# Patient Record
Sex: Male | Born: 1975 | Race: White | Hispanic: No | Marital: Single | Smoking: Current every day smoker
Health system: Southern US, Community
[De-identification: ages and names within clinical notes are randomized; demographics above are authoritative.]

## PROBLEM LIST (undated history)

## (undated) DIAGNOSIS — K219 Gastro-esophageal reflux disease without esophagitis: Secondary | ICD-10-CM

---

## 2000-03-26 ENCOUNTER — Emergency Department (HOSPITAL_COMMUNITY): Admission: EM | Admit: 2000-03-26 | Discharge: 2000-03-26 | Payer: Self-pay | Admitting: Emergency Medicine

## 2006-09-15 ENCOUNTER — Emergency Department (HOSPITAL_COMMUNITY): Admission: EM | Admit: 2006-09-15 | Discharge: 2006-09-15 | Payer: Self-pay | Admitting: Emergency Medicine

## 2008-09-03 IMAGING — CT CT HEAD W/O CM
1 series · 16 of 30 positions shown, 20 images · IV contrast (agent unspecified)
Comparison: None.

CLINICAL DATA: 30-year-old with head injury who fell.
 HEAD CT WITHOUT CONTRAST:
TECHNIQUE: Contiguous axial images were obtained from the base of the skull through the vertex according to standard protocol without contrast.

[Series 2: head_seq 4.5 h45s st · axial · 0.43mm/px · z∈[+1176,+1320]mm · 16 of 36 slices shown, 20 images]
[im 2/36  brain]
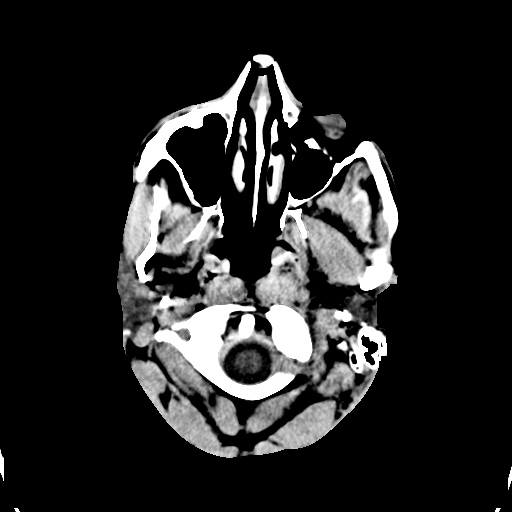
[im 2/36  bone]
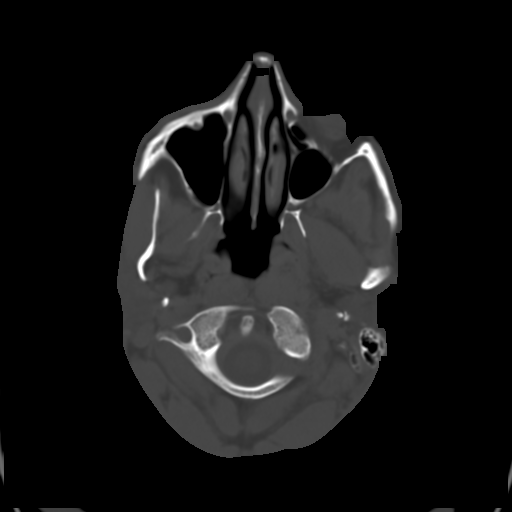
[im 4/36  brain]
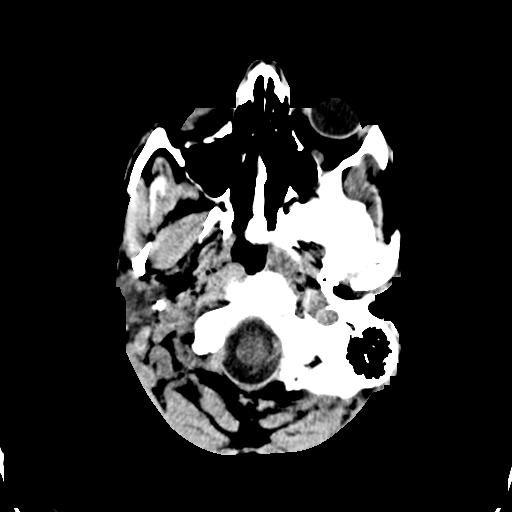
[im 7/36  brain]
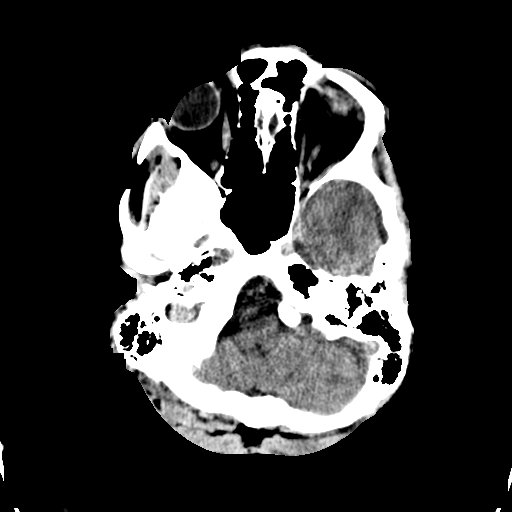
[im 9/36  brain]
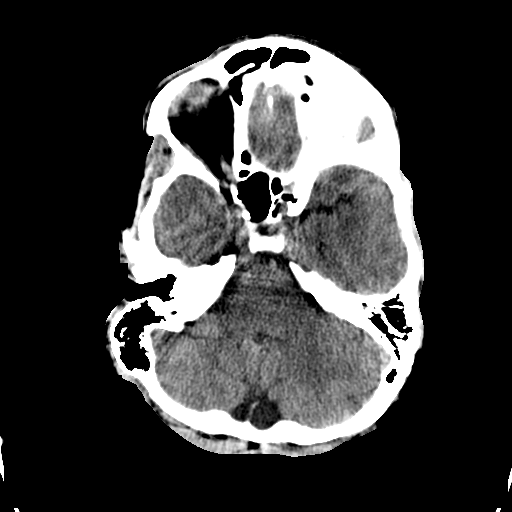
[im 10/36  brain]
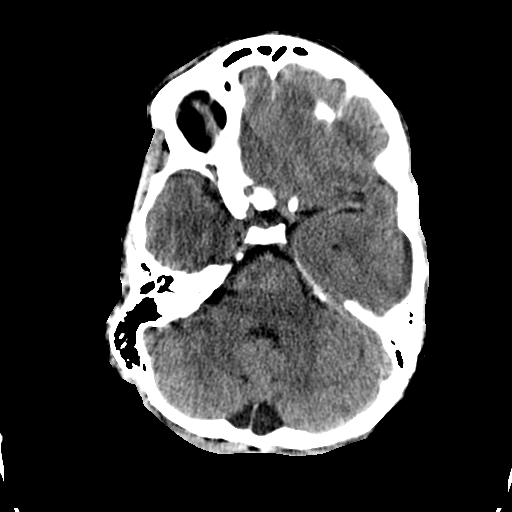
[im 10/36  bone]
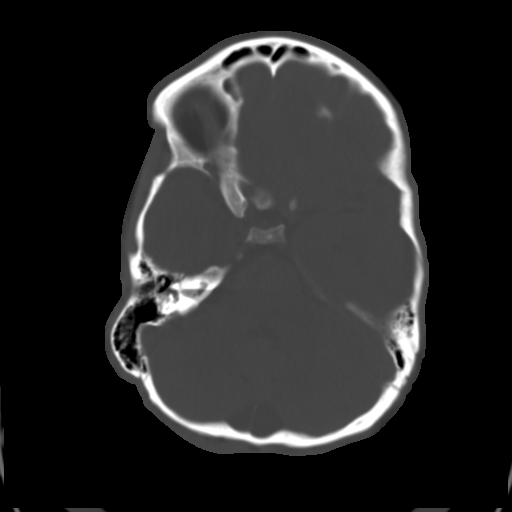
[im 13/36  brain]
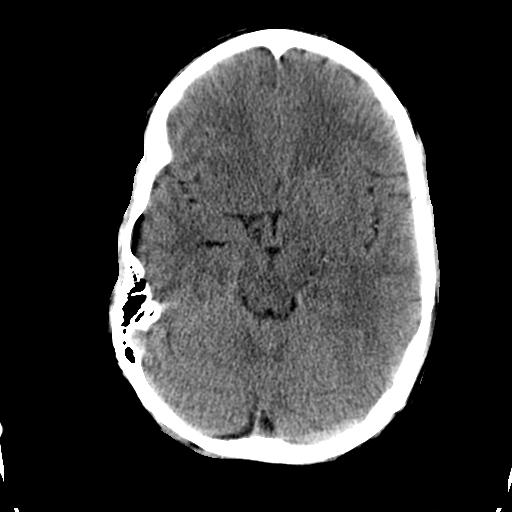
[im 15/36  brain]
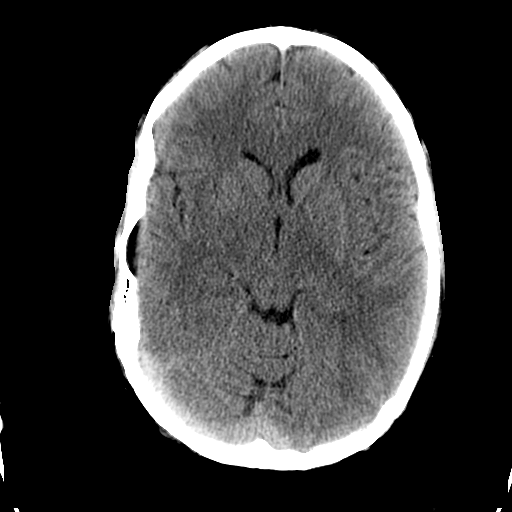
[im 17/36  brain]
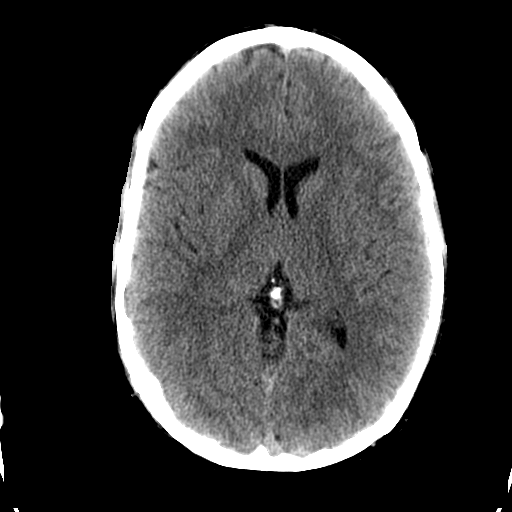
[im 19/36  brain]
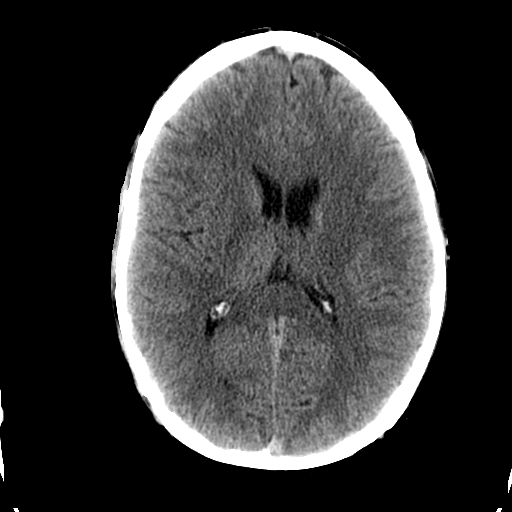
[im 19/36  bone]
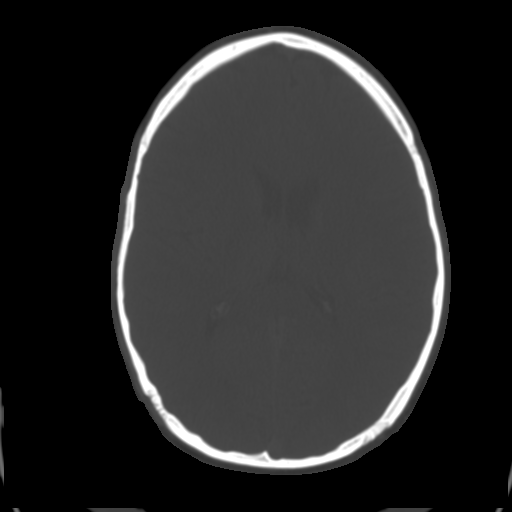
[im 21/36  brain]
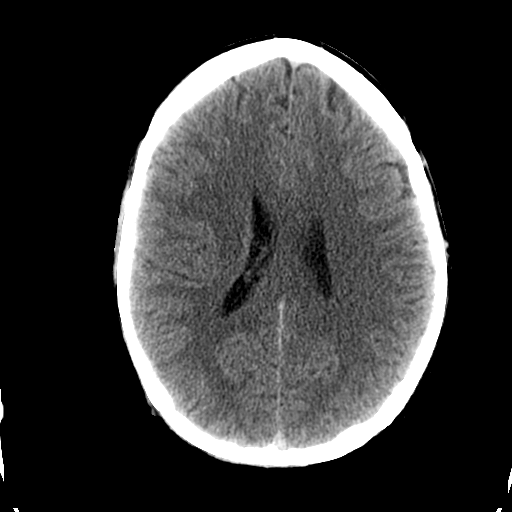
[im 23/36  brain]
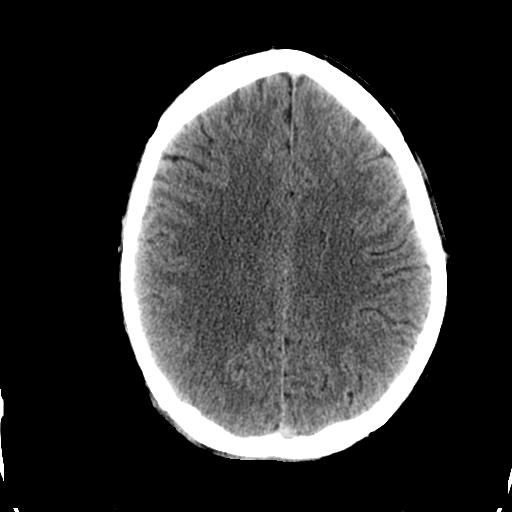
[im 26/36  brain]
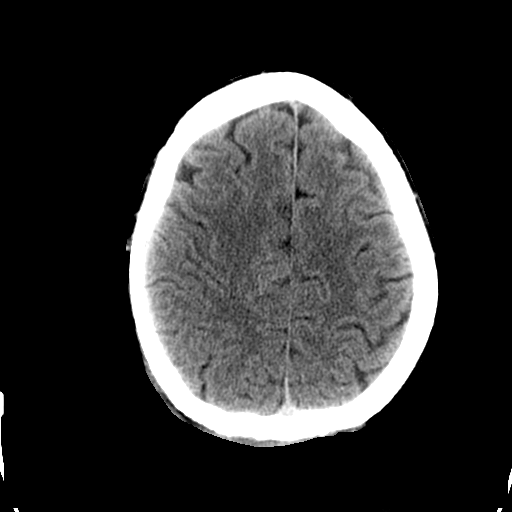
[im 27/36  brain]
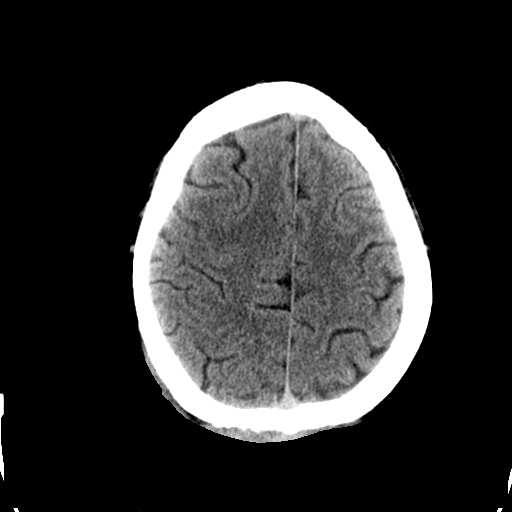
[im 27/36  bone]
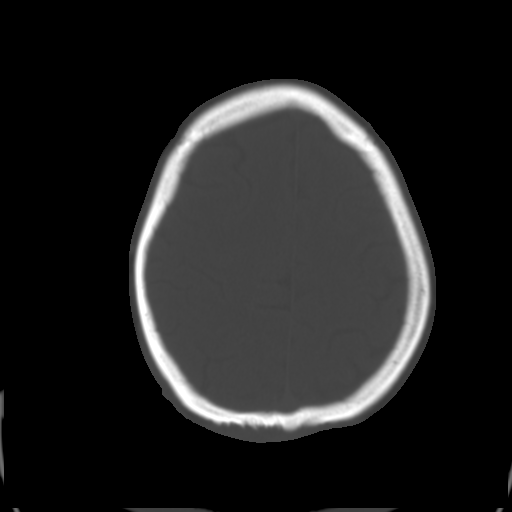
[im 29/36  brain]
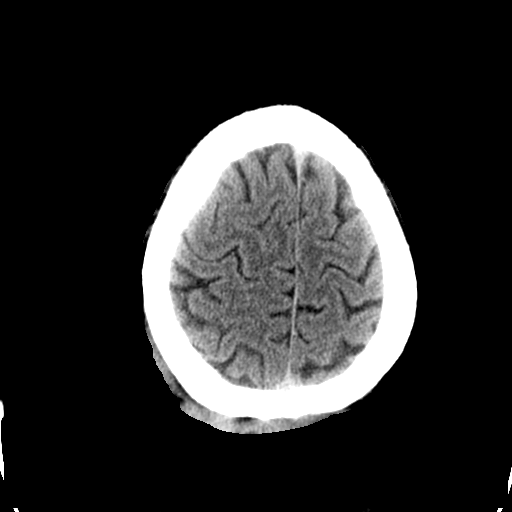
[im 32/36  brain]
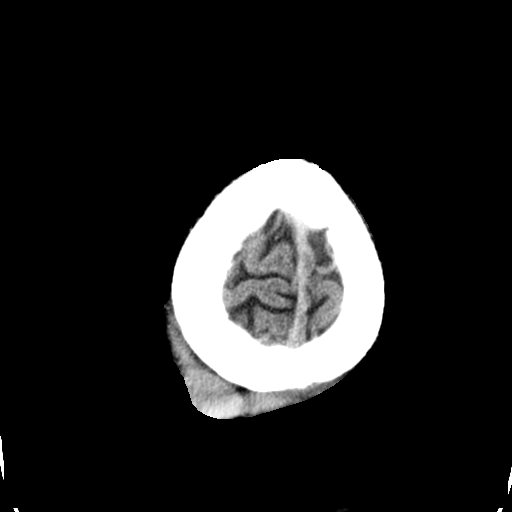
[im 34/36  brain]
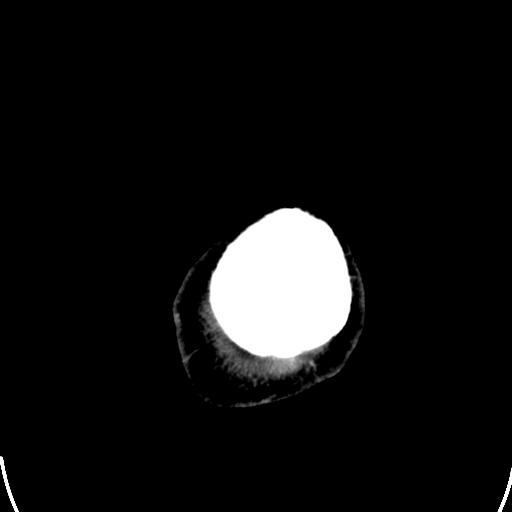

[16 of 30 positions shown; findings below may reference images not displayed]

FINDINGS: There is a large scalp hematoma in the high posterior vertex region.  I do not see any underlying skull fracture.  Intracranially, the ventricles are in the midline without mass effect or shift. They are normal in size and configuration. No extraaxial fluid collections are seen. No CT evidence for acute intracranial abnormality. No intracranial mass lesions. The brainstem and cerebellum are normal in appearance. 
 The visualized paranasal sinuses and mastoid air cells are clear. The globes are intact.
IMPRESSION: 1.  Large scalp hematoma at the right vertex posteriorly.  No underlying skull fracture.
 2.  No acute intracranial abnormality.

## 2014-03-07 ENCOUNTER — Encounter (HOSPITAL_COMMUNITY): Payer: Self-pay | Admitting: Emergency Medicine

## 2014-03-07 ENCOUNTER — Emergency Department (HOSPITAL_COMMUNITY)
Admission: EM | Admit: 2014-03-07 | Discharge: 2014-03-07 | Disposition: A | Discharge status: Home / Self Care | Payer: BC Managed Care – PPO | Attending: Emergency Medicine | Admitting: Emergency Medicine

## 2014-03-07 DIAGNOSIS — N61 Mastitis without abscess: Secondary | ICD-10-CM | POA: Insufficient documentation

## 2014-03-07 DIAGNOSIS — F17200 Nicotine dependence, unspecified, uncomplicated: Secondary | ICD-10-CM | POA: Diagnosis not present

## 2014-03-07 DIAGNOSIS — L089 Local infection of the skin and subcutaneous tissue, unspecified: Secondary | ICD-10-CM | POA: Diagnosis not present

## 2014-03-07 DIAGNOSIS — J069 Acute upper respiratory infection, unspecified: Secondary | ICD-10-CM | POA: Diagnosis not present

## 2014-03-07 DIAGNOSIS — Z880 Allergy status to penicillin: Secondary | ICD-10-CM | POA: Insufficient documentation

## 2014-03-07 MED ORDER — CLINDAMYCIN HCL 150 MG PO CAPS: 150.0000 mg | ORAL_CAPSULE | Freq: Three times a day (TID) | ORAL | Status: DC

## 2014-03-07 MED ORDER — HYDROCODONE-HOMATROPINE 5-1.5 MG/5ML PO SYRP: 5.0000 mL | ORAL_SOLUTION | Freq: Four times a day (QID) | ORAL | Status: DC | PRN

## 2014-03-07 NOTE — Discharge Instructions (Signed)
As discussed you need to follow up to have an ultrasound of your breast. Upper Respiratory Infection, Adult An upper respiratory infection (URI) is also known as the common cold. It is often caused by a type of germ (virus). Colds are easily spread (contagious). You can pass it to others by kissing, coughing, sneezing, or drinking out of the same glass. Usually, you get better in 1 or 2 weeks.  HOME CARE   Only take medicine as told by your doctor.  Use a warm mist humidifier or breathe in steam from a hot shower.  Drink enough water and fluids to keep your pee (urine) clear or pale yellow.  Get plenty of rest.  Return to work when your temperature is back to normal or as told by your doctor. You may use a face mask and wash your hands to stop your cold from spreading. GET HELP RIGHT AWAY IF:   After the first few days, you feel you are getting worse.  You have questions about your medicine.  You have chills, shortness of breath, or brown or red spit (mucus).  You have yellow or brown snot (nasal discharge) or pain in the face, especially when you bend forward.  You have a fever, puffy (swollen) neck, pain when you swallow, or white spots in the back of your throat.  You have a bad headache, ear pain, sinus pain, or chest pain.  You have a high-pitched whistling sound when you breathe in and out (wheezing).  You have a lasting cough or cough up blood.  You have sore muscles or a stiff neck. MAKE SURE YOU:   Understand these instructions.  Will watch your condition.  Will get help right away if you are not doing well or get worse. Document Released: 12/26/2007 Document Revised: 10/01/2011 Document Reviewed: 10/14/2013 [facility] Patient Information 2015 [employer], [place]. This information is not intended to replace advice given to you by your health care provider. Make sure you discuss any questions you have with your health care provider.

## 2014-03-07 NOTE — ED Provider Notes (Signed)
CSN: [id number]     Arrival date & time 03/07/14  1217 History  This chart was scribed for non-physician practitioner, Teressa LowerVrinda Keirah Konitzer, NP working with Layla MawKristen N Ward, DO by Greggory StallionKayla Andersen, ED scribe. This patient was seen in room WTR6/WTR6 and the patient's care was started at 1:11 PM.    Chief Complaint  Patient presents with  . Abscess  . URI   The history is provided by the patient. No language interpreter was used.   HPI Comments: Walter Barajas is a 38 y.o. male who presents to the Emergency Department complaining of nonproductive cough and nasal congestion that started 3 days ago. Pt is also complaining of an abscess to his left nipple that has been there for about 9 months. States he has had it evaluated at an Urgent Care but never had it I&D. He has never had an ultrasound of the area. Denies fever, sore throat, ear pain.   History reviewed. No pertinent past medical history. History reviewed. No pertinent past surgical history. No family history on file. History  Substance Use Topics  . Smoking status: Current Every Day Smoker -- 1.00 packs/day for 20 years    Types: Cigarettes  . Smokeless tobacco: Not on file  . Alcohol Use: 25.2 oz/week    42 Cans of beer per week    Review of Systems  Constitutional: Negative for fever.  HENT: Positive for congestion. Negative for ear pain and sore throat.   Respiratory: Positive for cough.   Skin:       Abscess.  All other systems reviewed and are negative.  Allergies  Bee venom and Penicillins  Home Medications   Prior to Admission medications   Not on File   BP 125/87  Pulse 87  Temp(Src) 98 F (36.7 C) (Oral)  Resp 18  SpO2 98%  Physical Exam  Nursing note and vitals reviewed. Constitutional: He is oriented to person, place, and time. He appears well-developed and well-nourished. No distress.  HENT:  Head: Normocephalic and atraumatic.  Right Ear: Tympanic membrane and ear canal normal.  Left Ear: Tympanic  membrane and ear canal normal.  Mouth/Throat: Posterior oropharyngeal erythema present. No oropharyngeal exudate or posterior oropharyngeal edema.  Eyes: Conjunctivae and EOM are normal.  Neck: Neck supple. No tracheal deviation present.  Cardiovascular: Normal rate, regular rhythm and normal heart sounds.   Pulmonary/Chest: Effort normal and breath sounds normal. No respiratory distress. He has no wheezes. He has no rales.  Genitourinary:  Left breast has red scabbed area without fluctuance or firmness  Musculoskeletal: Normal range of motion.  Neurological: He is alert and oriented to person, place, and time.  Skin: Skin is warm and dry.  Psychiatric: He has a normal mood and affect. His behavior is normal.    ED Course  Procedures (including critical care time)  DIAGNOSTIC STUDIES: Oxygen Saturation is 98% on RA, normal by my interpretation.    COORDINATION OF CARE: 1:13 PM-Discussed treatment plan which includes an antibiotic with pt at bedside and pt agreed to plan. Will give pt PCP referrals and advised him to follow up.   Labs Review Labs Reviewed - No data to display  Imaging Review No results found.   EKG Interpretation None      MDM   Final diagnoses:  Skin infection  URI (upper respiratory infection)    Discussed with pt that he needs to have ultrasound for further evaluation of the area since it is recurring  I personally performed the  services described in this documentation, which was scribed in my presence. The recorded information has been reviewed and is accurate.  Teressa Lower, NP 03/07/14 1325

## 2014-03-07 NOTE — ED Provider Notes (Signed)
Medical screening examination/treatment/procedure(s) were performed by non-physician practitioner and as supervising physician I was immediately available for consultation/collaboration.   EKG Interpretation None        Kristen N Ward, DO 03/07/14 1508 

## 2014-03-07 NOTE — ED Notes (Signed)
Pt reports having an abscess to the left nipple that has been there for 9 months. Pt also reports having a rash to the back of the neck. Pt also reports cold symptoms: non-productive cough and nasal congestion. Pt denies fever. Pt is A/O x4, in NAD, and vitals are WDL.

## 2015-06-07 ENCOUNTER — Other Ambulatory Visit: Payer: Self-pay | Admitting: General Surgery

## 2020-02-16 ENCOUNTER — Other Ambulatory Visit: Payer: Self-pay | Admitting: Physician Assistant

## 2020-02-16 DIAGNOSIS — R2242 Localized swelling, mass and lump, left lower limb: Secondary | ICD-10-CM

## 2020-02-22 ENCOUNTER — Ambulatory Visit
Admission: RE | Admit: 2020-02-22 | Discharge: 2020-02-22 | Disposition: A | Discharge status: Home / Self Care | Payer: No Typology Code available for payment source | Source: Ambulatory Visit | Attending: Physician Assistant | Admitting: Physician Assistant

## 2020-02-22 DIAGNOSIS — R2242 Localized swelling, mass and lump, left lower limb: Secondary | ICD-10-CM

## 2022-02-10 IMAGING — US US EXTREM LOW*L* LIMITED
1 series · 8 of 8 positions shown · non-contrast
Comparison: None.

CLINICAL DATA: Painful subcutaneous mass x7 months in the left
lower extremity.

EXAM:
ULTRASOUND LEFT LOWER EXTREMITY LIMITED
TECHNIQUE: Ultrasound examination of the lower extremity soft tissues was
performed in the area of clinical concern.

[Series 1: us extrem low*left* limited · 0.05mm/px · 8 of 8 slices shown]
[im 1/8]
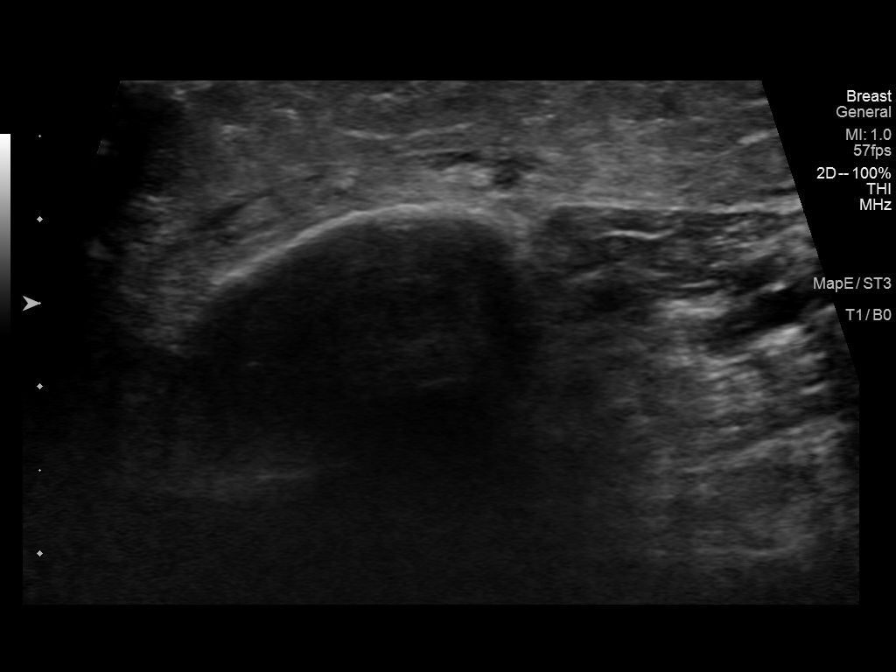
[im 2/8]
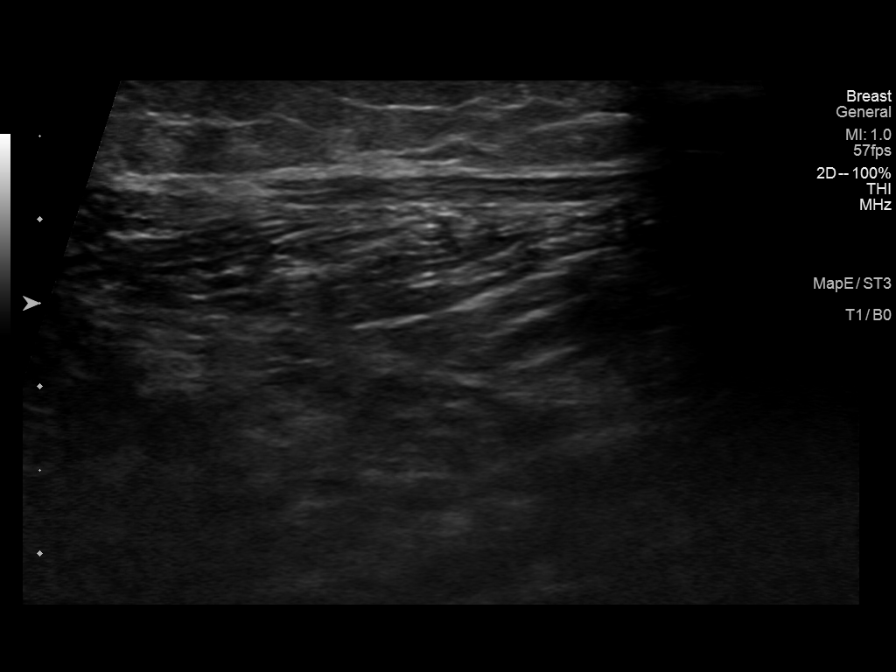
[im 3/8]
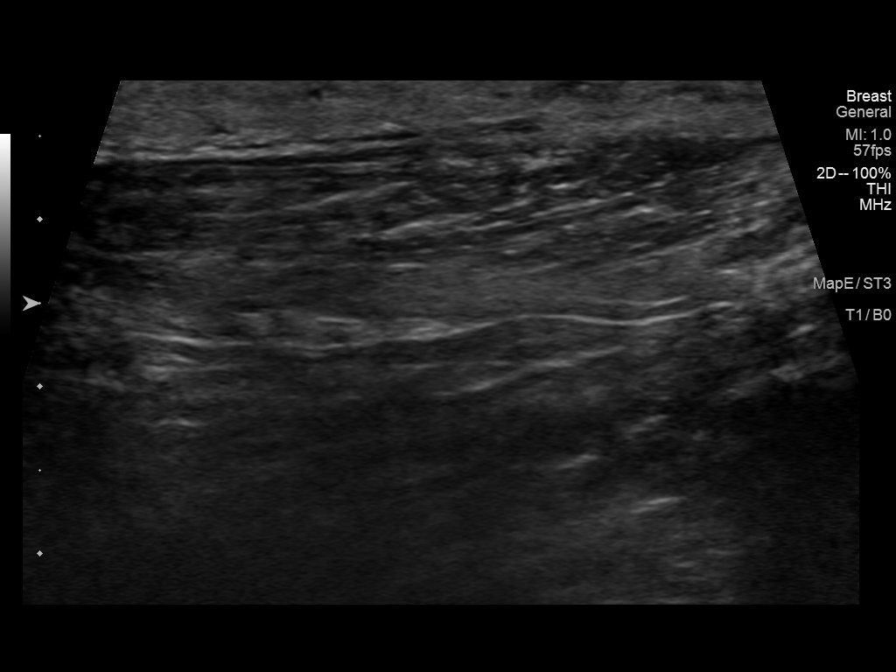
[im 4/8]
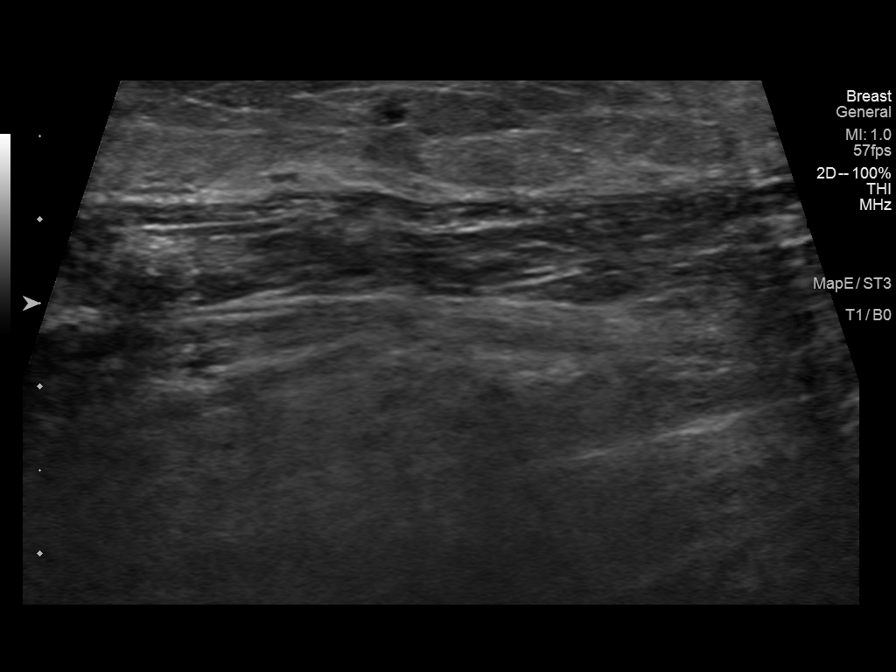
[im 5/8]
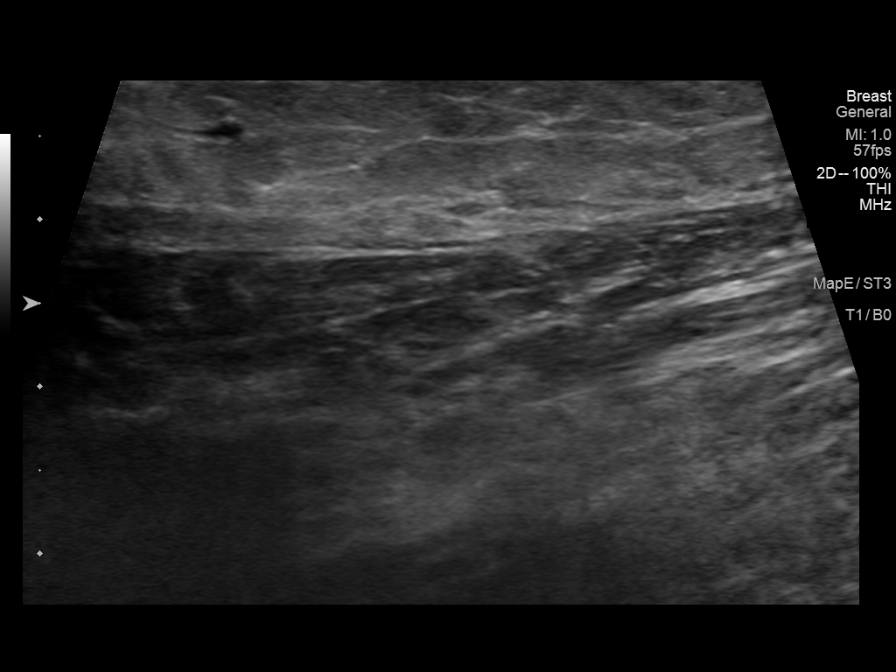
[im 6/8]
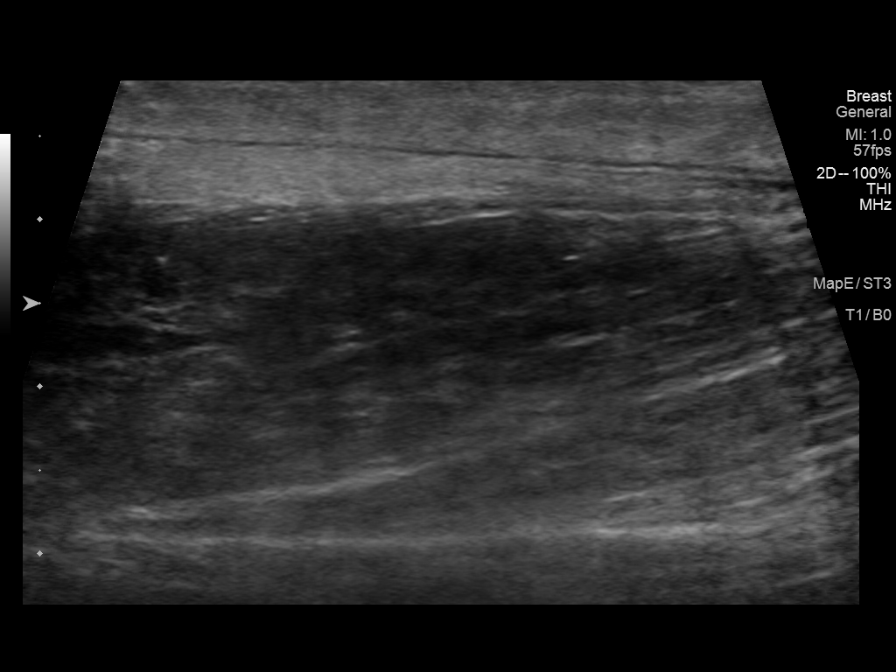
[im 7/8]
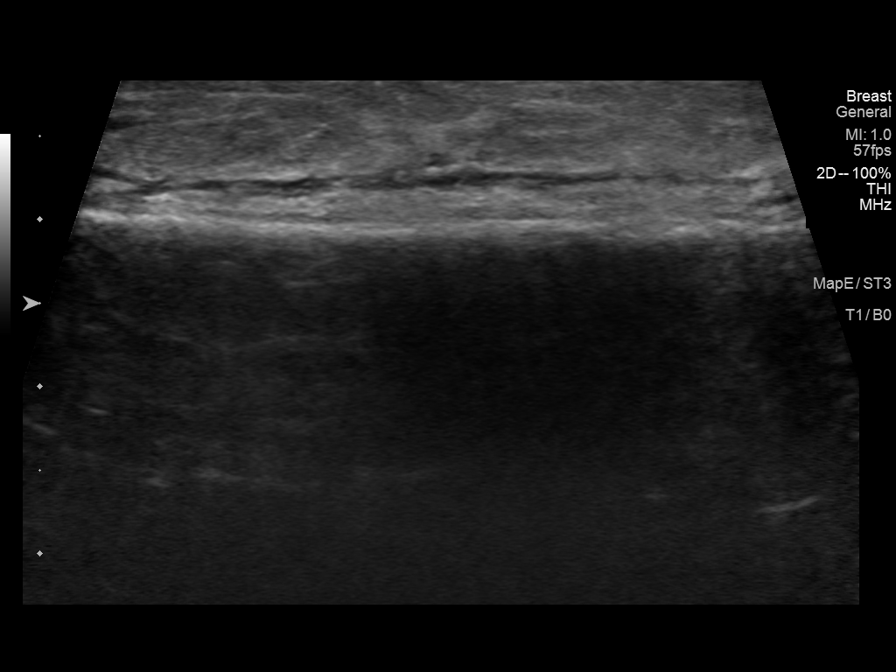
[im 8/8]
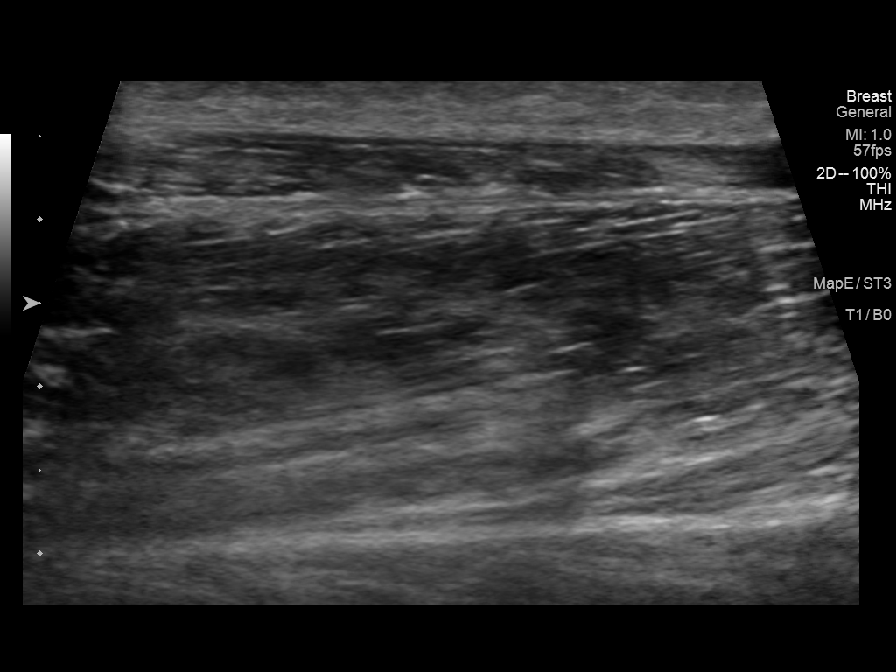

[8 of 8 positions shown; findings below may reference images not displayed]

FINDINGS: Sonographic evaluation of the patient's palpable area of concern
demonstrated no significant abnormality. There was no mass or fluid
collection.
IMPRESSION: No sonographic abnormality detected.

## 2023-06-05 ENCOUNTER — Other Ambulatory Visit: Payer: Self-pay

## 2023-06-05 ENCOUNTER — Inpatient Hospital Stay (HOSPITAL_COMMUNITY)
Admission: EM | Admit: 2023-06-05 | Discharge: 2023-06-09 | DRG: 398 | Disposition: A | Discharge status: Home / Self Care | Payer: 59 | Source: Ambulatory Visit | Attending: General Surgery | Admitting: General Surgery

## 2023-06-05 ENCOUNTER — Emergency Department (HOSPITAL_BASED_OUTPATIENT_CLINIC_OR_DEPARTMENT_OTHER): Payer: 59

## 2023-06-05 ENCOUNTER — Encounter (HOSPITAL_COMMUNITY): Admission: EM | Disposition: A | Discharge status: Home / Self Care | Payer: Self-pay | Source: Ambulatory Visit

## 2023-06-05 ENCOUNTER — Emergency Department (HOSPITAL_COMMUNITY): Payer: 59

## 2023-06-05 ENCOUNTER — Encounter (HOSPITAL_COMMUNITY): Payer: Self-pay | Admitting: *Deleted

## 2023-06-05 DIAGNOSIS — R7989 Other specified abnormal findings of blood chemistry: Secondary | ICD-10-CM | POA: Diagnosis present

## 2023-06-05 DIAGNOSIS — K3533 Acute appendicitis with perforation and localized peritonitis, with abscess: Secondary | ICD-10-CM | POA: Diagnosis not present

## 2023-06-05 DIAGNOSIS — F17210 Nicotine dependence, cigarettes, uncomplicated: Secondary | ICD-10-CM | POA: Diagnosis present

## 2023-06-05 DIAGNOSIS — Z91030 Bee allergy status: Secondary | ICD-10-CM

## 2023-06-05 DIAGNOSIS — Z79899 Other long term (current) drug therapy: Secondary | ICD-10-CM

## 2023-06-05 DIAGNOSIS — Z881 Allergy status to other antibiotic agents status: Secondary | ICD-10-CM

## 2023-06-05 DIAGNOSIS — K3532 Acute appendicitis with perforation and localized peritonitis, without abscess: Secondary | ICD-10-CM | POA: Diagnosis present

## 2023-06-05 DIAGNOSIS — E785 Hyperlipidemia, unspecified: Secondary | ICD-10-CM | POA: Diagnosis present

## 2023-06-05 DIAGNOSIS — Z880 Allergy status to penicillin: Secondary | ICD-10-CM

## 2023-06-05 DIAGNOSIS — K37 Unspecified appendicitis: Secondary | ICD-10-CM | POA: Diagnosis not present

## 2023-06-05 DIAGNOSIS — K3580 Unspecified acute appendicitis: Principal | ICD-10-CM

## 2023-06-05 DIAGNOSIS — Z791 Long term (current) use of non-steroidal anti-inflammatories (NSAID): Secondary | ICD-10-CM

## 2023-06-05 DIAGNOSIS — R203 Hyperesthesia: Secondary | ICD-10-CM | POA: Diagnosis not present

## 2023-06-05 DIAGNOSIS — K567 Ileus, unspecified: Secondary | ICD-10-CM | POA: Diagnosis not present

## 2023-06-05 HISTORY — PX: LAPAROSCOPIC APPENDECTOMY: SHX408

## 2023-06-05 HISTORY — DX: Gastro-esophageal reflux disease without esophagitis: K21.9

## 2023-06-05 LAB — CBC
HCT: 44.4 % (ref 39.0–52.0)
Hemoglobin: 15.3 g/dL (ref 13.0–17.0)
MCH: 32.1 pg (ref 26.0–34.0)
MCHC: 34.5 g/dL (ref 30.0–36.0)
MCV: 93.3 fL (ref 80.0–100.0)
Platelets: 294 10*3/uL (ref 150–400)
RBC: 4.76 MIL/uL (ref 4.22–5.81)
RDW: 12.6 % (ref 11.5–15.5)
WBC: 13.9 10*3/uL — ABNORMAL HIGH (ref 4.0–10.5)
nRBC: 0 % (ref 0.0–0.2)

## 2023-06-05 LAB — URINALYSIS, ROUTINE W REFLEX MICROSCOPIC
Bilirubin Urine: NEGATIVE
Glucose, UA: NEGATIVE mg/dL
Hgb urine dipstick: NEGATIVE
Ketones, ur: NEGATIVE mg/dL
Leukocytes,Ua: NEGATIVE
Nitrite: NEGATIVE
Protein, ur: NEGATIVE mg/dL
Specific Gravity, Urine: 1.023 (ref 1.005–1.030)
pH: 6 (ref 5.0–8.0)

## 2023-06-05 LAB — HIV ANTIBODY (ROUTINE TESTING W REFLEX): HIV Screen 4th Generation wRfx: NONREACTIVE

## 2023-06-05 LAB — COMPREHENSIVE METABOLIC PANEL
ALT: 68 U/L — ABNORMAL HIGH (ref 0–44)
AST: 45 U/L — ABNORMAL HIGH (ref 15–41)
Albumin: 4.1 g/dL (ref 3.5–5.0)
Alkaline Phosphatase: 127 U/L — ABNORMAL HIGH (ref 38–126)
Anion gap: 9 (ref 5–15)
BUN: 19 mg/dL (ref 6–20)
CO2: 23 mmol/L (ref 22–32)
Calcium: 10 mg/dL (ref 8.9–10.3)
Chloride: 101 mmol/L (ref 98–111)
Creatinine, Ser: 0.91 mg/dL (ref 0.61–1.24)
GFR, Estimated: >60 mL/min (ref >60)
Glucose, Bld: 117 mg/dL — ABNORMAL HIGH (ref 70–99)
Potassium: 3.8 mmol/L (ref 3.5–5.1)
Sodium: 133 mmol/L — ABNORMAL LOW (ref 135–145)
Total Bilirubin: 0.7 mg/dL (ref <1.2)
Total Protein: 7.3 g/dL (ref 6.5–8.1)

## 2023-06-05 LAB — LIPASE, BLOOD: Lipase: 30 U/L (ref 11–51)

## 2023-06-05 SURGERY — APPENDECTOMY, LAPAROSCOPIC
Anesthesia: General | Site: Abdomen

## 2023-06-05 MED ORDER — LIDOCAINE 2% (20 MG/ML) 5 ML SYRINGE
INTRAMUSCULAR | Status: DC | PRN
  Administered 2023-06-05: 60 mg via INTRAVENOUS

## 2023-06-05 MED ORDER — ACETAMINOPHEN 650 MG RE SUPP
650.0000 mg | Freq: Four times a day (QID) | RECTAL | Status: DC
  Filled 2023-06-05: qty 1

## 2023-06-05 MED ORDER — OXYCODONE HCL 5 MG PO TABS
ORAL_TABLET | ORAL | Status: AC
  Filled 2023-06-05: qty 1

## 2023-06-05 MED ORDER — 0.9 % SODIUM CHLORIDE (POUR BTL) OPTIME
TOPICAL | Status: DC | PRN
  Administered 2023-06-05: 1000 mL

## 2023-06-05 MED ORDER — DEXAMETHASONE SODIUM PHOSPHATE 10 MG/ML IJ SOLN
INTRAMUSCULAR | Status: AC
  Filled 2023-06-05: qty 1

## 2023-06-05 MED ORDER — ROCURONIUM BROMIDE 10 MG/ML (PF) SYRINGE
PREFILLED_SYRINGE | INTRAVENOUS | Status: AC
Start: 2023-06-05 — End: ?
  Filled 2023-06-05: qty 10

## 2023-06-05 MED ORDER — MIDAZOLAM HCL 2 MG/2ML IJ SOLN
INTRAMUSCULAR | Status: AC
  Filled 2023-06-05: qty 2

## 2023-06-05 MED ORDER — DEXMEDETOMIDINE HCL IN NACL 80 MCG/20ML IV SOLN
INTRAVENOUS | Status: AC
  Filled 2023-06-05: qty 20

## 2023-06-05 MED ORDER — PROCHLORPERAZINE MALEATE 10 MG PO TABS: 10.0000 mg | ORAL_TABLET | Freq: Four times a day (QID) | ORAL | Status: DC | PRN

## 2023-06-05 MED ORDER — ONDANSETRON HCL 4 MG/2ML IJ SOLN
INTRAMUSCULAR | Status: DC | PRN
  Administered 2023-06-05: 4 mg via INTRAVENOUS

## 2023-06-05 MED ORDER — CHLORHEXIDINE GLUCONATE CLOTH 2 % EX PADS: 6.0000 | MEDICATED_PAD | Freq: Once | CUTANEOUS | Status: DC

## 2023-06-05 MED ORDER — ONDANSETRON HCL 4 MG/2ML IJ SOLN
INTRAMUSCULAR | Status: AC
  Filled 2023-06-05: qty 2

## 2023-06-05 MED ORDER — PHENYLEPHRINE 80 MCG/ML (10ML) SYRINGE FOR IV PUSH (FOR BLOOD PRESSURE SUPPORT)
PREFILLED_SYRINGE | INTRAVENOUS | Status: AC
  Filled 2023-06-05: qty 10

## 2023-06-05 MED ORDER — POLYETHYLENE GLYCOL 3350 17 G PO PACK: 17.0000 g | PACK | Freq: Every day | ORAL | Status: DC | PRN

## 2023-06-05 MED ORDER — BUPIVACAINE-EPINEPHRINE (PF) 0.25% -1:200000 IJ SOLN
INTRAMUSCULAR | Status: AC
  Filled 2023-06-05: qty 30

## 2023-06-05 MED ORDER — PANTOPRAZOLE SODIUM 40 MG PO TBEC
40.0000 mg | DELAYED_RELEASE_TABLET | Freq: Every day | ORAL | Status: DC
  Administered 2023-06-05 – 2023-06-08 (×4): 40 mg via ORAL
  Filled 2023-06-05 (×5): qty 1

## 2023-06-05 MED ORDER — ENOXAPARIN SODIUM 40 MG/0.4ML IJ SOSY
40.0000 mg | PREFILLED_SYRINGE | INTRAMUSCULAR | Status: DC
  Administered 2023-06-06 – 2023-06-08 (×3): 40 mg via SUBCUTANEOUS
  Filled 2023-06-05 (×4): qty 0.4

## 2023-06-05 MED ORDER — OXYCODONE HCL 5 MG PO TABS
5.0000 mg | ORAL_TABLET | ORAL | Status: DC | PRN
  Administered 2023-06-05: 10 mg via ORAL
  Administered 2023-06-06: 5 mg via ORAL
  Administered 2023-06-06 (×2): 10 mg via ORAL
  Filled 2023-06-05: qty 1
  Filled 2023-06-05 (×3): qty 2

## 2023-06-05 MED ORDER — DEXTROSE-SODIUM CHLORIDE 5-0.9 % IV SOLN: INTRAVENOUS | Status: AC

## 2023-06-05 MED ORDER — DOCUSATE SODIUM 100 MG PO CAPS
100.0000 mg | ORAL_CAPSULE | Freq: Two times a day (BID) | ORAL | Status: DC
  Administered 2023-06-05 – 2023-06-08 (×6): 100 mg via ORAL
  Filled 2023-06-05 (×7): qty 1

## 2023-06-05 MED ORDER — FENTANYL CITRATE (PF) 250 MCG/5ML IJ SOLN
INTRAMUSCULAR | Status: DC | PRN
  Administered 2023-06-05: 50 ug via INTRAVENOUS
  Administered 2023-06-05: 100 ug via INTRAVENOUS

## 2023-06-05 MED ORDER — METRONIDAZOLE 500 MG/100ML IV SOLN
500.0000 mg | Freq: Two times a day (BID) | INTRAVENOUS | Status: DC
  Administered 2023-06-05 – 2023-06-09 (×8): 500 mg via INTRAVENOUS
  Filled 2023-06-05 (×8): qty 100

## 2023-06-05 MED ORDER — ONDANSETRON 4 MG PO TBDP
8.0000 mg | ORAL_TABLET | Freq: Once | ORAL | Status: AC
  Administered 2023-06-05: 8 mg via ORAL
  Filled 2023-06-05: qty 2

## 2023-06-05 MED ORDER — METHOCARBAMOL 500 MG PO TABS
500.0000 mg | ORAL_TABLET | Freq: Three times a day (TID) | ORAL | Status: DC | PRN
  Administered 2023-06-05: 500 mg via ORAL

## 2023-06-05 MED ORDER — ROCURONIUM BROMIDE 10 MG/ML (PF) SYRINGE
PREFILLED_SYRINGE | INTRAVENOUS | Status: DC | PRN
  Administered 2023-06-05: 50 mg via INTRAVENOUS
  Administered 2023-06-05: 10 mg via INTRAVENOUS

## 2023-06-05 MED ORDER — CHLORHEXIDINE GLUCONATE 0.12 % MT SOLN
OROMUCOSAL | Status: AC
  Administered 2023-06-05: 15 mL via OROMUCOSAL
  Filled 2023-06-05: qty 15

## 2023-06-05 MED ORDER — METRONIDAZOLE 500 MG/100ML IV SOLN
500.0000 mg | Freq: Once | INTRAVENOUS | Status: AC
  Administered 2023-06-05: 500 mg via INTRAVENOUS
  Filled 2023-06-05: qty 100

## 2023-06-05 MED ORDER — ORAL CARE MOUTH RINSE: 15.0000 mL | Freq: Once | OROMUCOSAL | Status: AC

## 2023-06-05 MED ORDER — EPHEDRINE 5 MG/ML INJ
INTRAVENOUS | Status: AC
  Filled 2023-06-05: qty 5

## 2023-06-05 MED ORDER — SODIUM CHLORIDE 0.9 % IV SOLN
2.0000 g | Freq: Once | INTRAVENOUS | Status: AC
  Administered 2023-06-05: 2 g via INTRAVENOUS
  Filled 2023-06-05: qty 20

## 2023-06-05 MED ORDER — LIDOCAINE 2% (20 MG/ML) 5 ML SYRINGE
INTRAMUSCULAR | Status: AC
Start: 2023-06-05 — End: ?
  Filled 2023-06-05: qty 5

## 2023-06-05 MED ORDER — ACETAMINOPHEN 500 MG PO TABS: 1000.0000 mg | ORAL_TABLET | Freq: Once | ORAL | Status: DC

## 2023-06-05 MED ORDER — SUGAMMADEX SODIUM 200 MG/2ML IV SOLN
INTRAVENOUS | Status: DC | PRN
  Administered 2023-06-05: 200 mg via INTRAVENOUS

## 2023-06-05 MED ORDER — LACTATED RINGERS IV SOLN: INTRAVENOUS | Status: DC

## 2023-06-05 MED ORDER — SODIUM CHLORIDE 0.9 % IV SOLN
2.0000 g | Freq: Three times a day (TID) | INTRAVENOUS | Status: DC
  Administered 2023-06-05 – 2023-06-09 (×11): 2 g via INTRAVENOUS
  Filled 2023-06-05 (×11): qty 12.5

## 2023-06-05 MED ORDER — METHOCARBAMOL 1000 MG/10ML IJ SOLN: 500.0000 mg | Freq: Three times a day (TID) | INTRAMUSCULAR | Status: DC | PRN

## 2023-06-05 MED ORDER — PHENYLEPHRINE 80 MCG/ML (10ML) SYRINGE FOR IV PUSH (FOR BLOOD PRESSURE SUPPORT)
PREFILLED_SYRINGE | INTRAVENOUS | Status: DC | PRN
  Administered 2023-06-05: 80 ug via INTRAVENOUS
  Administered 2023-06-05: 160 ug via INTRAVENOUS

## 2023-06-05 MED ORDER — DEXMEDETOMIDINE HCL IN NACL 80 MCG/20ML IV SOLN
INTRAVENOUS | Status: DC | PRN
  Administered 2023-06-05: 8 ug via INTRAVENOUS

## 2023-06-05 MED ORDER — FENTANYL CITRATE (PF) 100 MCG/2ML IJ SOLN
25.0000 ug | INTRAMUSCULAR | Status: DC | PRN
  Administered 2023-06-05: 50 ug via INTRAVENOUS

## 2023-06-05 MED ORDER — NICOTINE 14 MG/24HR TD PT24
14.0000 mg | MEDICATED_PATCH | Freq: Every day | TRANSDERMAL | Status: DC
  Filled 2023-06-05 (×3): qty 1

## 2023-06-05 MED ORDER — METOPROLOL TARTRATE 5 MG/5ML IV SOLN: 5.0000 mg | Freq: Four times a day (QID) | INTRAVENOUS | Status: DC | PRN

## 2023-06-05 MED ORDER — OXYCODONE HCL 5 MG PO TABS
5.0000 mg | ORAL_TABLET | Freq: Once | ORAL | Status: AC
  Administered 2023-06-05: 5 mg via ORAL

## 2023-06-05 MED ORDER — IOPAMIDOL (ISOVUE-370) INJECTION 76%
75.0000 mL | Freq: Once | INTRAVENOUS | Status: AC | PRN
  Administered 2023-06-05: 75 mL via INTRAVENOUS

## 2023-06-05 MED ORDER — DIPHENHYDRAMINE HCL 50 MG/ML IJ SOLN: 25.0000 mg | Freq: Four times a day (QID) | INTRAMUSCULAR | Status: DC | PRN

## 2023-06-05 MED ORDER — MORPHINE SULFATE (PF) 2 MG/ML IV SOLN: 2.0000 mg | INTRAVENOUS | Status: DC | PRN

## 2023-06-05 MED ORDER — ONDANSETRON 4 MG PO TBDP: 4.0000 mg | ORAL_TABLET | Freq: Four times a day (QID) | ORAL | Status: DC | PRN

## 2023-06-05 MED ORDER — DEXAMETHASONE SODIUM PHOSPHATE 10 MG/ML IJ SOLN
INTRAMUSCULAR | Status: AC
  Filled 2023-06-05: qty 2

## 2023-06-05 MED ORDER — KETOROLAC TROMETHAMINE 30 MG/ML IJ SOLN
INTRAMUSCULAR | Status: AC
Start: 2023-06-05 — End: ?
  Filled 2023-06-05: qty 2

## 2023-06-05 MED ORDER — HYDROMORPHONE HCL 1 MG/ML IJ SOLN
1.0000 mg | INTRAMUSCULAR | Status: DC | PRN
Start: 2023-06-05 — End: 2023-06-09

## 2023-06-05 MED ORDER — PROPOFOL 10 MG/ML IV BOLUS
INTRAVENOUS | Status: DC | PRN
  Administered 2023-06-05: 180 mg via INTRAVENOUS

## 2023-06-05 MED ORDER — MIDAZOLAM HCL 2 MG/2ML IJ SOLN
INTRAMUSCULAR | Status: DC | PRN
  Administered 2023-06-05: 2 mg via INTRAVENOUS

## 2023-06-05 MED ORDER — SODIUM CHLORIDE 0.9 % IR SOLN
Status: DC | PRN
  Administered 2023-06-05: 1000 mL

## 2023-06-05 MED ORDER — METHOCARBAMOL 500 MG PO TABS
ORAL_TABLET | ORAL | Status: AC
  Filled 2023-06-05: qty 1

## 2023-06-05 MED ORDER — ONDANSETRON HCL 4 MG/2ML IJ SOLN: 4.0000 mg | Freq: Four times a day (QID) | INTRAMUSCULAR | Status: DC | PRN

## 2023-06-05 MED ORDER — CELECOXIB 200 MG PO CAPS: 200.0000 mg | ORAL_CAPSULE | Freq: Once | ORAL | Status: DC

## 2023-06-05 MED ORDER — FENTANYL CITRATE (PF) 250 MCG/5ML IJ SOLN
INTRAMUSCULAR | Status: AC
  Filled 2023-06-05: qty 5

## 2023-06-05 MED ORDER — BUPIVACAINE-EPINEPHRINE 0.5% -1:200000 IJ SOLN
INTRAMUSCULAR | Status: DC | PRN
  Administered 2023-06-05: 8 mL

## 2023-06-05 MED ORDER — CHLORHEXIDINE GLUCONATE 0.12 % MT SOLN: 15.0000 mL | Freq: Once | OROMUCOSAL | Status: AC

## 2023-06-05 MED ORDER — DIPHENHYDRAMINE HCL 25 MG PO CAPS: 25.0000 mg | ORAL_CAPSULE | Freq: Four times a day (QID) | ORAL | Status: DC | PRN

## 2023-06-05 MED ORDER — DEXAMETHASONE SODIUM PHOSPHATE 10 MG/ML IJ SOLN
INTRAMUSCULAR | Status: DC | PRN
  Administered 2023-06-05: 10 mg via INTRAVENOUS

## 2023-06-05 MED ORDER — ACETAMINOPHEN 500 MG PO TABS
1000.0000 mg | ORAL_TABLET | Freq: Four times a day (QID) | ORAL | Status: DC
  Administered 2023-06-06 – 2023-06-08 (×8): 1000 mg via ORAL
  Filled 2023-06-05 (×10): qty 2

## 2023-06-05 MED ORDER — PROCHLORPERAZINE EDISYLATE 10 MG/2ML IJ SOLN: 5.0000 mg | Freq: Four times a day (QID) | INTRAMUSCULAR | Status: DC | PRN

## 2023-06-05 MED ORDER — FENTANYL CITRATE (PF) 100 MCG/2ML IJ SOLN
INTRAMUSCULAR | Status: AC
  Administered 2023-06-05: 50 ug via INTRAVENOUS
  Filled 2023-06-05: qty 2

## 2023-06-05 SURGICAL SUPPLY — 45 items
APPLIER CLIP ROT 10 11.4 M/L (STAPLE)
BAG COUNTER SPONGE SURGICOUNT (BAG) ×1 IMPLANT
BLADE CLIPPER SURG (BLADE) IMPLANT
CANISTER SUCT 3000ML PPV (MISCELLANEOUS) ×1 IMPLANT
CHLORAPREP W/TINT 26 (MISCELLANEOUS) ×1 IMPLANT
CLIP APPLIE ROT 10 11.4 M/L (STAPLE) IMPLANT
COVER SURGICAL LIGHT HANDLE (MISCELLANEOUS) ×1 IMPLANT
CUTTER FLEX LINEAR 45M (STAPLE) ×1 IMPLANT
DERMABOND ADVANCED .7 DNX12 (GAUZE/BANDAGES/DRESSINGS) ×1 IMPLANT
DRAIN CHANNEL 19F RND (DRAIN) IMPLANT
ELECT REM PT RETURN 9FT ADLT (ELECTROSURGICAL) ×1
ELECTRODE REM PT RTRN 9FT ADLT (ELECTROSURGICAL) ×1 IMPLANT
ENDOLOOP SUT PDS II 0 18 (SUTURE) IMPLANT
GLOVE BIO SURGEON STRL SZ8 (GLOVE) ×1 IMPLANT
GLOVE BIOGEL PI IND STRL 8 (GLOVE) ×1 IMPLANT
GOWN STRL REUS W/ TWL LRG LVL3 (GOWN DISPOSABLE) ×2 IMPLANT
GOWN STRL REUS W/ TWL XL LVL3 (GOWN DISPOSABLE) ×1 IMPLANT
GOWN STRL REUS W/TWL LRG LVL3 (GOWN DISPOSABLE) ×2
GOWN STRL REUS W/TWL XL LVL3 (GOWN DISPOSABLE) ×1
HEMOSTAT ARISTA ABSORB 3G PWDR (HEMOSTASIS) IMPLANT
HEMOSTAT SURGICEL 2X14 (HEMOSTASIS) IMPLANT
IRRIG SUCT STRYKERFLOW 2 WTIP (MISCELLANEOUS) ×1
IRRIGATION SUCT STRKRFLW 2 WTP (MISCELLANEOUS) ×1 IMPLANT
KIT BASIN OR (CUSTOM PROCEDURE TRAY) ×1 IMPLANT
KIT TURNOVER KIT B (KITS) ×1 IMPLANT
NS IRRIG 1000ML POUR BTL (IV SOLUTION) ×1 IMPLANT
PAD ARMBOARD 7.5X6 YLW CONV (MISCELLANEOUS) ×2 IMPLANT
POUCH RETRIEVAL ECOSAC 10 (ENDOMECHANICALS) ×1 IMPLANT
RELOAD STAPLE 45 3.5 BLU ETS (ENDOMECHANICALS) ×1 IMPLANT
RELOAD STAPLE TA45 3.5 REG BLU (ENDOMECHANICALS) ×1 IMPLANT
SCISSORS LAP 5X35 DISP (ENDOMECHANICALS) ×1 IMPLANT
SET TUBE SMOKE EVAC HIGH FLOW (TUBING) ×1 IMPLANT
SHEARS HARMONIC ACE PLUS 36CM (ENDOMECHANICALS) ×1 IMPLANT
SPECIMEN JAR SMALL (MISCELLANEOUS) ×1 IMPLANT
SUT ETHILON 2 0 FS 18 (SUTURE) IMPLANT
SUT MON AB 4-0 PC3 18 (SUTURE) ×1 IMPLANT
TOWEL GREEN STERILE (TOWEL DISPOSABLE) ×1 IMPLANT
TOWEL GREEN STERILE FF (TOWEL DISPOSABLE) ×1 IMPLANT
TRAY FOLEY W/BAG SLVR 16FR (SET/KITS/TRAYS/PACK) ×1
TRAY FOLEY W/BAG SLVR 16FR ST (SET/KITS/TRAYS/PACK) ×1 IMPLANT
TRAY LAPAROSCOPIC MC (CUSTOM PROCEDURE TRAY) ×1 IMPLANT
TROCAR BALLN 12MMX100 BLUNT (TROCAR) ×1 IMPLANT
TROCAR XCEL BLADELESS 5X75MML (TROCAR) ×2 IMPLANT
WARMER LAPAROSCOPE (MISCELLANEOUS) ×1 IMPLANT
WATER STERILE IRR 1000ML POUR (IV SOLUTION) ×1 IMPLANT

## 2023-06-05 NOTE — Interval H&P Note (Signed)
History and Physical Interval Note:  06/05/2023 2:17 PM  Walter Barajas  has presented today for surgery, with the diagnosis of appendicitis.  The various methods of treatment have been discussed with the patient and family. After consideration of risks, benefits and other options for treatment, the patient has consented to  Procedure(s): APPENDECTOMY LAPAROSCOPIC (N/A) as a surgical intervention.  The patient's history has been reviewed, patient examined, no change in status, stable for surgery.  I have reviewed the patient's chart and labs.  Questions were answered to the patient's satisfaction.   The procedure has been discussed with the patient.  Alternative therapies have been discussed with the patient.  Operative risks include bleeding,  Infection,  Organ injury,  Nerve injury,  Blood vessel injury,  DVT,  Pulmonary embolism,  Death,  And possible reoperation.  Medical management risks include worsening of present situation.  The success of the procedure is 50 -90 % at treating patients symptoms.  The patient understands and agrees to proceed.   Jolayne Branson A Melainie Krinsky

## 2023-06-05 NOTE — ED Provider Notes (Signed)
[place] EMERGENCY DEPARTMENT AT [facility] Provider Note   CSN: [id number] Arrival date & time: 06/05/23  0350     History  Chief Complaint  Patient presents with   Abdominal Pain    Walter Barajas is a 47 y.o. male.   Abdominal Pain   47 year old male presents emergency department with complaints of abdominal pain.  Patient states that his abdominal pain began this past Thursday reports pain around his umbilicus that radiated some to his right lower quadrant.  Was seen at urgent care subsequently on Friday and told that he needed a CT scan.  Patient states that the pain went away so he went home.  Reports recurrence of pain around midnight.  Again, describes pain in periumbilical region.  Reports nausea with no emesis.  States that his pain was radiating to his right flank area on Friday but is not currently doing the same.  Denies any urinary symptoms, fever, change in bowel habits  No significant pertinent past medical history.  Home Medications Prior to Admission medications   Medication Sig Start Date End Date Taking? Authorizing Provider  clindamycin (CLEOCIN) 150 MG capsule Take 1 capsule (150 mg total) by mouth 3 (three) times daily. 03/07/14   Teressa Lower, NP  HYDROcodone-homatropine (HYDROMET) 5-1.5 MG/5ML syrup Take 5 mLs by mouth every 6 (six) hours as needed for cough. 03/07/14   Teressa Lower, NP      Allergies    Bee venom and Penicillins    Review of Systems   Review of Systems  Gastrointestinal:  Positive for abdominal pain.  All other systems reviewed and are negative.   Physical Exam Updated Vital Signs BP (!) 132/91 (BP Location: Right Arm)   Pulse 90   Temp 98.6 F (37 C)   Resp 16   Ht 5\' 9"  (1.753 m)   Wt 72.6 kg   SpO2 100%   BMI 23.63 kg/m  Physical Exam Vitals and nursing note reviewed.  Constitutional:      General: He is not in acute distress.    Appearance: He is well-developed.  HENT:     Head:  Normocephalic and atraumatic.  Eyes:     Conjunctiva/sclera: Conjunctivae normal.  Cardiovascular:     Rate and Rhythm: Normal rate and regular rhythm.     Heart sounds: No murmur heard. Pulmonary:     Effort: Pulmonary effort is normal. No respiratory distress.     Breath sounds: Normal breath sounds.  Abdominal:     Palpations: Abdomen is soft.     Tenderness: There is abdominal tenderness in the right lower quadrant and periumbilical area. There is no right CVA tenderness or guarding. Negative signs include Murphy's sign.     Comments: Slight tenderness right lower quadrant.  Musculoskeletal:        General: No swelling.     Cervical back: Neck supple.  Skin:    General: Skin is warm and dry.     Capillary Refill: Capillary refill takes less than 2 seconds.  Neurological:     Mental Status: He is alert.  Psychiatric:        Mood and Affect: Mood normal.     ED Results / Procedures / Treatments   Labs (all labs ordered are listed, but only abnormal results are displayed) Labs Reviewed  COMPREHENSIVE METABOLIC PANEL - Abnormal; Notable for the following components:      Result Value   Sodium 133 (*)    Glucose, Bld 117 (*)  AST 45 (*)    ALT 68 (*)    Alkaline Phosphatase 127 (*)    All other components within normal limits  CBC - Abnormal; Notable for the following components:   WBC 13.9 (*)    All other components within normal limits  LIPASE, BLOOD  URINALYSIS, ROUTINE W REFLEX MICROSCOPIC    EKG None  Radiology CT ABDOMEN PELVIS W CONTRAST  Result Date: 06/05/2023 CLINICAL DATA:  Right lower quadrant abdominal pain. Suspected appendicitis. EXAM: CT ABDOMEN AND PELVIS WITH CONTRAST TECHNIQUE: Multidetector CT imaging of the abdomen and pelvis was performed using the standard protocol following bolus administration of intravenous contrast. RADIATION DOSE REDUCTION: This exam was performed according to the departmental dose-optimization program which includes  automated exposure control, adjustment of the mA and/or kV according to patient size and/or use of iterative reconstruction technique. CONTRAST:  75mL ISOVUE-370 IOPAMIDOL (ISOVUE-370) INJECTION 76% COMPARISON:  None Available. FINDINGS: Lower chest:  No contributory findings. Hepatobiliary: No focal liver abnormality.No evidence of biliary obstruction or stone. Pancreas: Unremarkable. Spleen: Unremarkable. Adrenals/Urinary Tract: Negative adrenals. No hydronephrosis or stone. Unremarkable bladder. Stomach/Bowel: Pronounced inflammation around the thickened appendix which measures up to 13 mm in thickness. Appendicolith is present at the appendiceal base. The appendix is partially retrocecal. Submucosal low-density thickening at the base of cecum and secondarily involving the terminal ileum. Expected enlargement of regional lymph nodes. No perforation or abscess. No ileus or bowel obstruction. Vascular/Lymphatic: No acute vascular abnormality. Scattered atheromatous calcification of the aorta and iliacs, notable for age. No mass or adenopathy. Reproductive:No pathologic findings. Other: No ascites or pneumoperitoneum. Musculoskeletal: No acute abnormalities. IMPRESSION: Acute suppurative appendicitis with appendicolith.  No abscess. Electronically Signed   By: Tiburcio Pea M.D.   On: 06/05/2023 06:23    Procedures Procedures    Medications Ordered in ED Medications  cefTRIAXone (ROCEPHIN) 2 g in sodium chloride 0.9 % 100 mL IVPB (has no administration in time range)    And  metroNIDAZOLE (FLAGYL) IVPB 500 mg (has no administration in time range)  ondansetron (ZOFRAN-ODT) disintegrating tablet 8 mg (8 mg Oral Given 06/05/23 0542)  iopamidol (ISOVUE-370) 76 % injection 75 mL (75 mLs Intravenous Contrast Given 06/05/23 0600)    ED Course/ Medical Decision Making/ A&P Clinical Course as of 06/05/23 0640  Wed Jun 05, 2023  0630 Patient states that he has been sipping on flavored water today.  States  he had a glass of milk and a cookie around midnight.  Reports dinner around 530 last night including microwave chicken dinner. [CR]  0631 Patient still declining any pain medication at this time. [CR]  4540 Acute suppurative appendicitis. Began around midnight. WBC 13.9, last oral intake midnight. Nontoxic in appearance, no pain meds, have him on antibiotics. [CG]    Clinical Course User Index [CG] Al Decant, PA-C [CR] Peter Garter, [place]                                 Medical Decision Making Amount and/or Complexity of Data Reviewed Labs: ordered. Radiology: ordered.  Risk Prescription drug management.   This patient presents to the ED for concern of abdominal pain, this involves an extensive number of treatment options, and is a complaint that carries with it a high risk of complications and morbidity.  The differential diagnosis includes gastritis, PUD, cholecystitis, CBD pathology, SBO/LBO, volvulus, diverticulitis, appendicitis, pyelonephritis, nephrolithiasis, cystitis   Co morbidities that complicate the  patient evaluation  See HPI   Additional history obtained:  Additional history obtained from EMR External records from outside source obtained and reviewed including hospital records   Lab Tests:  I Ordered, and personally interpreted labs.  The pertinent results include: Leukocytosis of 13.9.  No evidence of anemia.  Placed within range.  Hyponatremia 133 but otherwise, electrolytes within normal limits.  Mild transaminitis with an AST of 45, ALT of 68 and elevation of alkaline phosphatase of 127.  No renal dysfunction.  UA without abnormality.   Imaging Studies ordered:  I ordered imaging studies including CT abdomen pelvis  I independently evaluated imaging studies and agreed with radiologist interpretation of acute suppurative appendicitis with appendicolith.  No obvious abscess.   Cardiac Monitoring: / EKG:  The patient was maintained on a  cardiac monitor.  I personally viewed and interpreted the cardiac monitored which showed an underlying rhythm of: Sinus rhythm   Consultations Obtained:  N/a   Problem List / ED Course / Critical interventions / Medication management  Abdominal pain I ordered medication including Zofran   Reevaluation of the patient after these medicines showed that the patient improved I have reviewed the patients home medicines and have made adjustments as needed   Social Determinants of Health:  Denies tobacco, illicit drug use   Test / Admission - Considered:  Abdominal pain Vitals signs within normal range and stable throughout visit. Laboratory/imaging studies significant for: See above 47 year old male presents emergency department with complaints of abdominal pain.  Abdominal pain began on Thursday which seemed to independently resolve on Friday.  Abdominal pain then recurred around midnight.  On exam, patient with reproducible tenderness right lower quadrant as well as periumbilical region.  Labs concerning for leukocytosis of 13.9.  CT imaging concerning for appendicitis.  Patient begun on IV antibiotics in the form of Rocephin and Flagyl.  At time of shift change, patient care handed off to [employer].  Patient stable on admission.        Final Clinical Impression(s) / ED Diagnoses Final diagnoses:  Acute appendicitis, unspecified acute appendicitis type    Rx / DC Orders ED Discharge Orders     None         Peter Garter, [place] 06/05/23 4098    Gloris Manchester, MD 06/05/23 [phone]

## 2023-06-05 NOTE — ED Provider Notes (Addendum)
  Physical Exam  BP (!) 132/91 (BP Location: Right Arm)   Pulse 90   Temp 98.6 F (37 C)   Resp 16   Ht 5\' 9"  (1.753 m)   Wt 72.6 kg   SpO2 100%   BMI 23.63 kg/m   Physical Exam Vitals and nursing note reviewed.  Constitutional:      General: He is not in acute distress.    Appearance: He is well-developed.  HENT:     Head: Normocephalic and atraumatic.  Eyes:     Conjunctiva/sclera: Conjunctivae normal.  Cardiovascular:     Rate and Rhythm: Normal rate and regular rhythm.     Heart sounds: No murmur heard. Pulmonary:     Effort: Pulmonary effort is normal. No respiratory distress.     Breath sounds: Normal breath sounds.  Abdominal:     Palpations: Abdomen is soft.     Tenderness: There is abdominal tenderness.  Musculoskeletal:        General: No swelling.     Cervical back: Neck supple.  Skin:    General: Skin is warm and dry.     Capillary Refill: Capillary refill takes less than 2 seconds.  Neurological:     Mental Status: He is alert.  Psychiatric:        Mood and Affect: Mood normal.     Procedures  Procedures  ED Course / MDM   Clinical Course as of 06/05/23 0703  Wed Jun 05, 2023  0630 Patient states that he has been sipping on flavored water today.  States he had a glass of milk and a cookie around midnight.  Reports dinner around 530 last night including microwave chicken dinner. [CR]  0631 Patient still declining any pain medication at this time. [CR]  1610 Acute suppurative appendicitis. Began around midnight. WBC 13.9, last oral intake midnight. Nontoxic in appearance, no pain meds, have him on antibiotics. [CG]    Clinical Course User Index [CG] Al Decant, PA-C [CR] Peter Garter, [place]   Medical Decision Making Amount and/or Complexity of Data Reviewed Labs: ordered. Radiology: ordered.  Risk Prescription drug management. Decision regarding hospitalization.   Patient signed out to me at shift change pending consultation  with surgery.  In short, 47 year old male who presents with abdominal pain.  Found to have acute suppurative appendicitis on CT scan.  Antibiotics were started.  Patient was signed out to me pending consultation with surgery.  Discussed case with Dr. Doylene Canard.  They will see the patient.       Al Decant, PA-C 06/05/23 1055    Gerhard Munch, MD 06/07/23 [phone]

## 2023-06-05 NOTE — Discharge Instructions (Signed)

## 2023-06-05 NOTE — Anesthesia Preprocedure Evaluation (Signed)
Anesthesia Evaluation  Patient identified by MRN, date of birth, ID band Patient awake    Reviewed: Allergy & Precautions, NPO status , Patient's Chart, lab work & pertinent test results  Airway Mallampati: II  TM Distance: >3 FB Neck ROM: Full    Dental no notable dental hx.    Pulmonary neg pulmonary ROS, Current Smoker   Pulmonary exam normal        Cardiovascular negative cardio ROS  Rhythm:Regular Rate:Normal     Neuro/Psych negative neurological ROS  negative psych ROS   GI/Hepatic Neg liver ROS,GERD  Medicated,,Appendicitis    Endo/Other  negative endocrine ROS    Renal/GU negative Renal ROS  negative genitourinary   Musculoskeletal negative musculoskeletal ROS (+)    Abdominal Normal abdominal exam  (+)   Peds  Hematology Lab Results      Component                Value               Date                      WBC                      13.9 (H)            06/05/2023                HGB                      15.3                06/05/2023                HCT                      44.4                06/05/2023                MCV                      93.3                06/05/2023                PLT                      294                 06/05/2023             Lab Results      Component                Value               Date                      NA                       133 (L)             06/05/2023                K  3.8                 06/05/2023                CO2                      23                  06/05/2023                GLUCOSE                  117 (H)             06/05/2023                BUN                      19                  06/05/2023                CREATININE               0.91                06/05/2023                CALCIUM                  10.0                06/05/2023                GFRNONAA                 >60                 06/05/2023               Anesthesia Other Findings   Reproductive/Obstetrics                             Anesthesia Physical Anesthesia Plan  ASA: 2  Anesthesia Plan: General   Post-op Pain Management: Celebrex PO (pre-op)* and Tylenol PO (pre-op)*   Induction: Intravenous  PONV Risk Score and Plan: 1 and Ondansetron, Dexamethasone, Midazolam and Treatment may vary due to age or medical condition  Airway Management Planned: Mask and Oral ETT  Additional Equipment: None  Intra-op Plan:   Post-operative Plan: Extubation in OR  Informed Consent: I have reviewed the patients History and Physical, chart, labs and discussed the procedure including the risks, benefits and alternatives for the proposed anesthesia with the patient or authorized representative who has indicated his/her understanding and acceptance.     Dental advisory given  Plan Discussed with: CRNA  Anesthesia Plan Comments:        Anesthesia Quick Evaluation

## 2023-06-05 NOTE — Op Note (Signed)
Preoperative diagnosis acute appendicitis  Postoperative diagnosis perforated appendicitis with periappendiceal abscess  Procedure: Laparoscopic appendectomy  Surgeon: Harriette Bouillon, MD  Anesthesia: General With 0.25% Marcaine with epinephrine  EBL: 50 cc  Drains: 19 round to right lower quadrant  Specimen: Remnant appendiceal tissue to pathology  Indications for procedure: The patient is a 47 year old male with acute appendicitis.  CT scan showed a large dilated inflamed appendix.  Treatment options were reviewed with the patient.  Risks and benefits of surgical and nonsurgical options available.  After discussion he agreed to proceed with laparoscopic appendectomy.The procedure has been discussed with the patient.  Alternative therapies have been discussed with the patient.  Operative risks include bleeding,  Infection,  Organ injury,  Nerve injury,  Blood vessel injury,  DVT,  Pulmonary embolism,  Death,  And possible reoperation.  Medical management risks include worsening of present situation.  The success of the procedure is 50 -90 % at treating patients symptoms.  The patient understands and agrees to proceed.    Description of procedure: The patient was met in the holding area questions were answered.  The procedure was reviewed with the patient.  He was taken back to the operating.  He was placed supine upon the operating table.  After induction of general anesthesia, his abdomen was prepped and draped in sterile fashion and timeout performed.  A 1 cm supra Billick incision was made and dissection was carried down the fascia.  The fascia was opened in the midline.  Pursestring suture was placed in the fascia and a 12 mm port was placed under direct vision.  Pneumoperitoneum was created to 15 mmHg of CO2 pressure.  Laparoscope placed.  A 5 mm port was placed in the right upper quadrant and an additional 5 mm port was placed in the left lower quadrant both under direct vision.  The patient  was rolled to his left and placed in Trendelenburg.  The cecum was densely adherent to the right pelvic sidewall.  The appendix was not obviously visible.  I found the terminal ileum as it entered the cecum.  This was adhesed densely to the right lower pelvic wall.  I opted to mobilize the cecum.  Upon doing so a large abscess was encountered in the retroperitoneum.  The appendix was identified and had already ruptured and was into discrete pieces and other fragments.  I was able to find the junction of the appendix with the cecum.  There was significant dense edema and inflammation.  I was able to place a GIA 45 stapler across the base of the cecum at the junction of the appendiceal remnant.  This was fired and appeared to be healthy tissue.  I then debrided was left the appendix from the retroperitoneum in a piecemeal fashion.  These fragments were removed and passed off the field.  There is significant using from this area of controlled with Arista and Surgicel.  This provided adequate hemostasis.  I opted to place a drain in this area where the abscess was located.  This was brought out through the left lower quadrant port sites and secured to the skin with 2-0 nylon.  Upon inspection there was no further bleeding.  Four-quadrant laparoscopy revealed normal anatomy with no evidence of injury otherwise from port insertion or manipulation.  The CO2 was allowed to escape after removal of the umbilical port site.  The fascia was closed with 0 Vicryl.  Once all the ports were removed skin closed with 4-0 Monocryl.  Drain placed to bulb suction.  All counts found to be correct.  The patient was awoke extubated taken to recovery in satisfactory condition.

## 2023-06-05 NOTE — ED Triage Notes (Signed)
Pt arrives to ED c/o umbilical ABD pain. Pt reports pain has been present since Thursday and pt was directed here by Urgent Care for possible appendicitis. Pt endorses nausea, no vomiting. Denies elimination issues.

## 2023-06-05 NOTE — Transfer of Care (Signed)
Immediate Anesthesia Transfer of Care Note  Patient: Walter Barajas  Procedure(s) Performed: APPENDECTOMY LAPAROSCOPIC (Abdomen)  Patient Location: PACU  Anesthesia Type:General  Level of Consciousness: awake  Airway & Oxygen Therapy: Patient Spontanous Breathing  Post-op Assessment: Report given to RN  Post vital signs: Reviewed  Last Vitals:  Vitals Value Taken Time  BP 141/101 06/05/23 1715  Temp 36.6 C 06/05/23 1700  Pulse 78 06/05/23 1725  Resp 21 06/05/23 1725  SpO2 95 % 06/05/23 1725  Vitals shown include unfiled device data.  Last Pain:  Vitals:   06/05/23 1700  TempSrc:   PainSc: 5       Patients Stated Pain Goal: 3 (06/05/23 1700)  Complications: No notable events documented.

## 2023-06-05 NOTE — Anesthesia Procedure Notes (Signed)
Procedure Name: Intubation Date/Time: 06/05/2023 2:53 PM  Performed by: Eugene Gavia, RNPre-anesthesia Checklist: Patient identified, Emergency Drugs available, Suction available and Patient being monitored Patient Re-evaluated:Patient Re-evaluated prior to induction Oxygen Delivery Method: Circle System Utilized Preoxygenation: Pre-oxygenation with 100% oxygen Induction Type: IV induction Ventilation: Mask ventilation without difficulty Laryngoscope Size: Miller and 2 Grade View: Grade I Tube type: Oral Tube size: 7.0 mm Number of attempts: 1 Airway Equipment and Method: Stylet and Oral airway Placement Confirmation: ETT inserted through vocal cords under direct vision, positive ETCO2 and breath sounds checked- equal and bilateral Secured at: 22 cm Tube secured with: Tape Dental Injury: Teeth and Oropharynx as per pre-operative assessment  Comments: SRNA successful DVL x1 under supervision

## 2023-06-05 NOTE — H&P (Signed)
Surgical Evaluation  Chief Complaint: Abdominal pain  HPI: 47 year old male PMH HLD and GERD presented to the emergency department overnight with abdominal pain that actually began almost a week ago on Thursday.  Notes periumbilical pain that radiates to the right lower quadrant.  He did seek evaluation at an urgent care last Friday and was told he needed a CT scan but the pain resolved and so he did not pursue further workup.  Pain recurred last night around midnight and is similar in distribution.  Associated nausea but no emesis.  No known fever, no diarrhea or constipation. CT scan shows acute appendicitis. General surgery asked to see.  Last ate a cookie and milk around midnight, and drank some water with flavoring around 0330  He has had no previous abdominal surgery Last colonoscopy about 2 months ago by Dr. 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  No past surgical history on file.  No family history on file.  Social History   Socioeconomic History   Marital status: Single    Spouse name: Not on file   Number of children: Not on file   Years of education: Not on file   Highest education level: Not on file  Occupational History   Not on file  Tobacco Use   Smoking status: Every Day    Current packs/day: 1.00    Average packs/day: 1 pack/day for 20.0 years (20.0 ttl pk-yrs)    Types: Cigarettes   Smokeless tobacco: Not on file  Substance and Sexual Activity   Alcohol use: Yes    Alcohol/week: 42.0 standard drinks of alcohol    Types: 42 Cans of beer per week   Drug use: No   Sexual activity: Not on file  Other Topics Concern   Not on file  Social History Narrative   Not on file   Social Determinants of Health    Financial Resource Strain: Not on file  Food Insecurity: Not on file  Transportation Needs: Not on file  Physical Activity: Not on file  Stress: Not on file  Social Connections: Unknown (11/25/2021)   Received from [facility]   Social Network    Social Network: Not on file    No current facility-administered medications on file prior to encounter.   Current Outpatient Medications on File Prior to Encounter  Medication Sig Dispense Refill   atorvastatin (LIPITOR) 20 MG tablet Take 20 mg by mouth daily.     meloxicam (MOBIC) 15 MG tablet Take 15 mg by mouth daily.      Review of Systems: a complete, 10pt review of systems was completed with pertinent positives and negatives as documented in the HPI  Physical Exam: Vitals:   06/05/23 0354 06/05/23 0448  BP: (!) 151/101 (!) 132/91  Pulse: 97 90  Resp: 18 16  Temp: 98.6 F (37 C)   SpO2: 98% 100%   Gen: A&Ox3, no distress  Eyes: lids and conjunctivae normal, no icterus. Pupils equally round and reactive to light.  Neck: supple without mass or thyromegaly Chest: respiratory effort is normal. No crepitus or tenderness on palpation of the chest. Breath sounds equal.  Cardiovascular: RRR  Gastrointestinal: soft, nondistended. No mass, hepatomegaly or splenomegaly. No  hernia. Mild RLQ TTP without rebound or guarding Muscoloskeletal: no clubbing or cyanosis of the fingers.  Strength is symmetrical throughout.  Range of motion of bilateral upper and lower extremities normal without pain, crepitation or contracture. Neuro: cranial nerves grossly intact.  Sensation intact to light touch diffusely. Psych: appropriate mood and affect, normal insight/judgment intact  Skin: warm and dry      Latest Ref Rng & Units 06/05/2023    4:02 AM  CBC  WBC 4.0 - 10.5 K/uL 13.9   Hemoglobin 13.0 - 17.0 g/dL 91.4   Hematocrit 78.2 - 52.0 % 44.4   Platelets 150 - 400 K/uL 294        Latest Ref Rng & Units 06/05/2023    4:02 AM  CMP   Glucose 70 - 99 mg/dL 956   BUN 6 - 20 mg/dL 19   Creatinine 2.13 - 1.24 mg/dL 0.86   Sodium 578 - 469 mmol/L 133   Potassium 3.5 - 5.1 mmol/L 3.8   Chloride 98 - 111 mmol/L 101   CO2 22 - 32 mmol/L 23   Calcium 8.9 - 10.3 mg/dL 62.9   Total Protein 6.5 - 8.1 g/dL 7.3   Total Bilirubin <5.2 mg/dL 0.7   Alkaline Phos 38 - 126 U/L 127   AST 15 - 41 U/L 45   ALT 0 - 44 U/L 68     No results found for: "INR", "PROTIME"  Imaging: CT ABDOMEN PELVIS W CONTRAST  Result Date: 06/05/2023 CLINICAL DATA:  Right lower quadrant abdominal pain. Suspected appendicitis. EXAM: CT ABDOMEN AND PELVIS WITH CONTRAST TECHNIQUE: Multidetector CT imaging of the abdomen and pelvis was performed using the standard protocol following bolus administration of intravenous contrast. RADIATION DOSE REDUCTION: This exam was performed according to the departmental dose-optimization program which includes automated exposure control, adjustment of the mA and/or kV according to patient size and/or use of iterative reconstruction technique. CONTRAST:  75mL ISOVUE-370 IOPAMIDOL (ISOVUE-370) INJECTION 76% COMPARISON:  None Available. FINDINGS: Lower chest:  No contributory findings. Hepatobiliary: No focal liver abnormality.No evidence of biliary obstruction or stone. Pancreas: Unremarkable. Spleen: Unremarkable. Adrenals/Urinary Tract: Negative adrenals. No hydronephrosis or stone. Unremarkable bladder. Stomach/Bowel: Pronounced inflammation around the thickened appendix which measures up to 13 mm in thickness. Appendicolith is present at the appendiceal base. The appendix is partially retrocecal. Submucosal low-density thickening at the base of cecum and secondarily involving the terminal ileum. Expected enlargement of regional lymph nodes. No perforation or abscess. No ileus or bowel obstruction. Vascular/Lymphatic: No acute vascular abnormality. Scattered atheromatous calcification of the aorta and iliacs, notable for age. No  mass or adenopathy. Reproductive:No pathologic findings. Other: No ascites or pneumoperitoneum. Musculoskeletal: No acute abnormalities. IMPRESSION: Acute suppurative appendicitis with appendicolith.  No abscess. Electronically Signed   By: Tiburcio Pea M.D.   On: 06/05/2023 06:23     A/P: Acute appendicitis. I recommend proceeding with laparoscopic appendectomy. We discussed the surgery including risks of bleeding, infection, pain, scarring, injury to intra-abdominal structures, conversion to open surgery or more extensive resection, risk of staple line leak or delayed abscess, failure to resolve symptoms, postoperative ileus, as well as general risks of DVT/PE, pneumonia, stroke, heart attack, death. Questions were welcomed and answered to the patient's satisfaction.  Will plan for surgery today with Dr. Luisa Hart.    Elevated LFTs - no hepatobiliary abnormality on CT. Discussed with patient, he reports having labs in September 2024 that were normal. He reports starting OTC testosterone a few weeks ago. Encouraged him  to d/c  this medication and follow up with PCP to ensure LFTs normalize   Franne Forts, [facility] 06/05/2023, 8:15 AM Please see Amion for pager number during day hours 7:00am-4:30pm

## 2023-06-05 NOTE — Plan of Care (Signed)
  Problem: Clinical Measurements: Goal: Will remain free from infection Outcome: Progressing   Problem: Clinical Measurements: Goal: Cardiovascular complication will be avoided Outcome: Progressing   Problem: Nutrition: Goal: Adequate nutrition will be maintained Outcome: Progressing   Problem: Activity: Goal: Risk for activity intolerance will decrease Outcome: Progressing   Problem: Elimination: Goal: Will not experience complications related to urinary retention Outcome: Progressing   Problem: Pain Management: Goal: General experience of comfort will improve Outcome: Progressing

## 2023-06-06 ENCOUNTER — Encounter (HOSPITAL_COMMUNITY): Payer: Self-pay | Admitting: Surgery

## 2023-06-06 DIAGNOSIS — K3580 Unspecified acute appendicitis: Secondary | ICD-10-CM | POA: Diagnosis present

## 2023-06-06 DIAGNOSIS — Z79899 Other long term (current) drug therapy: Secondary | ICD-10-CM | POA: Diagnosis not present

## 2023-06-06 DIAGNOSIS — R203 Hyperesthesia: Secondary | ICD-10-CM | POA: Diagnosis not present

## 2023-06-06 DIAGNOSIS — F17210 Nicotine dependence, cigarettes, uncomplicated: Secondary | ICD-10-CM | POA: Diagnosis present

## 2023-06-06 DIAGNOSIS — Z791 Long term (current) use of non-steroidal anti-inflammatories (NSAID): Secondary | ICD-10-CM | POA: Diagnosis not present

## 2023-06-06 DIAGNOSIS — Z881 Allergy status to other antibiotic agents status: Secondary | ICD-10-CM | POA: Diagnosis not present

## 2023-06-06 DIAGNOSIS — K567 Ileus, unspecified: Secondary | ICD-10-CM | POA: Diagnosis not present

## 2023-06-06 DIAGNOSIS — E785 Hyperlipidemia, unspecified: Secondary | ICD-10-CM | POA: Diagnosis present

## 2023-06-06 DIAGNOSIS — R7989 Other specified abnormal findings of blood chemistry: Secondary | ICD-10-CM | POA: Diagnosis present

## 2023-06-06 DIAGNOSIS — K3533 Acute appendicitis with perforation and localized peritonitis, with abscess: Secondary | ICD-10-CM | POA: Diagnosis present

## 2023-06-06 DIAGNOSIS — Z880 Allergy status to penicillin: Secondary | ICD-10-CM | POA: Diagnosis not present

## 2023-06-06 DIAGNOSIS — Z91030 Bee allergy status: Secondary | ICD-10-CM | POA: Diagnosis not present

## 2023-06-06 LAB — BASIC METABOLIC PANEL
Anion gap: 8 (ref 5–15)
BUN: 13 mg/dL (ref 6–20)
CO2: 23 mmol/L (ref 22–32)
Calcium: 8.5 mg/dL — ABNORMAL LOW (ref 8.9–10.3)
Chloride: 103 mmol/L (ref 98–111)
Creatinine, Ser: 0.9 mg/dL (ref 0.61–1.24)
GFR, Estimated: >60 mL/min (ref >60)
Glucose, Bld: 131 mg/dL — ABNORMAL HIGH (ref 70–99)
Potassium: 3.5 mmol/L (ref 3.5–5.1)
Sodium: 134 mmol/L — ABNORMAL LOW (ref 135–145)

## 2023-06-06 LAB — CBC
HCT: 40.5 % (ref 39.0–52.0)
Hemoglobin: 13.6 g/dL (ref 13.0–17.0)
MCH: 32 pg (ref 26.0–34.0)
MCHC: 33.6 g/dL (ref 30.0–36.0)
MCV: 95.3 fL (ref 80.0–100.0)
Platelets: 260 10*3/uL (ref 150–400)
RBC: 4.25 MIL/uL (ref 4.22–5.81)
RDW: 12.9 % (ref 11.5–15.5)
WBC: 14.7 10*3/uL — ABNORMAL HIGH (ref 4.0–10.5)
nRBC: 0 % (ref 0.0–0.2)

## 2023-06-06 MED ORDER — OXYMETAZOLINE HCL 0.05 % NA SOLN
NASAL | Status: AC
  Filled 2023-06-06: qty 30

## 2023-06-06 NOTE — Progress Notes (Signed)
   06/06/23 1351  TOC Brief Assessment  Insurance and Status Reviewed  Patient has primary care physician Yes  Home environment has been reviewed home  Prior level of function: independent  Prior/Current Home Services No current home services  Social Determinants of Health Reivew SDOH reviewed no interventions necessary  Readmission risk has been reviewed Yes  Transition of care needs no transition of care needs at this time      Transition of Care Department [facility]) has reviewed patient and no TOC needs have been identified at this time. We will continue to monitor patient advancement through interdisciplinary progression rounds. If new patient transition needs arise, please place a TOC consult.

## 2023-06-06 NOTE — Anesthesia Postprocedure Evaluation (Signed)
Anesthesia Post Note  Patient: Walter Barajas  Procedure(s) Performed: APPENDECTOMY LAPAROSCOPIC (Abdomen)     Patient location during evaluation: PACU Anesthesia Type: General Level of consciousness: awake and alert Pain management: pain level controlled Vital Signs Assessment: post-procedure vital signs reviewed and stable Respiratory status: spontaneous breathing, nonlabored ventilation, respiratory function stable and patient connected to nasal cannula oxygen Cardiovascular status: blood pressure returned to baseline and stable Postop Assessment: no apparent nausea or vomiting Anesthetic complications: no   No notable events documented.  Last Vitals:  Vitals:   06/05/23 2036 06/06/23 0411  BP: (!) 136/93 (!) 126/91  Pulse: 85 95  Resp: 17 19  Temp: 37 C 37.5 C  SpO2: 93% 92%    Last Pain:  Vitals:   06/06/23 0521  TempSrc:   PainSc: Asleep                 Nelle Don Keyuana Wank

## 2023-06-06 NOTE — Progress Notes (Signed)
1 Day Post-Op   Subjective/Chief Complaint: Patient feels okay.     Objective: Vital signs in last 24 hours: Temp:  [97.8 F (36.6 C)-99.5 F (37.5 C)] 99.5 F (37.5 C) (11/14 0411) Pulse Rate:  [66-95] 95 (11/14 0411) Resp:  [14-20] 19 (11/14 0411) BP: (125-153)/(89-106) 126/91 (11/14 0411) SpO2:  [88 %-100 %] 92 % (11/14 0411)    Intake/Output from previous day: 11/13 0701 - 11/14 0700 In: 1000.3 [I.V.:800; IV Piggyback:200.3] Out: 90 [Drains:80; Blood:10] Intake/Output this shift: No intake/output data recorded.  General appearance: alert and cooperative Resp: clear to auscultation bilaterally Cardio: Normal sinus rhythm Incision/Wound: Port sites clean dry intact.  JP serous.  Hyperesthesia noted when pulling up GOWN  Lab Results:  Recent Labs    06/05/23 0402  WBC 13.9*  HGB 15.3  HCT 44.4  PLT 294   BMET Recent Labs    06/05/23 0402  NA 133*  K 3.8  CL 101  CO2 23  GLUCOSE 117*  BUN 19  CREATININE 0.91  CALCIUM 10.0   PT/INR No results for input(s): "LABPROT", "INR" in the last 72 hours. ABG No results for input(s): "PHART", "HCO3" in the last 72 hours.  Invalid input(s): "PCO2", "PO2"  Studies/Results: CT ABDOMEN PELVIS W CONTRAST  Result Date: 06/05/2023 CLINICAL DATA:  Right lower quadrant abdominal pain. Suspected appendicitis. EXAM: CT ABDOMEN AND PELVIS WITH CONTRAST TECHNIQUE: Multidetector CT imaging of the abdomen and pelvis was performed using the standard protocol following bolus administration of intravenous contrast. RADIATION DOSE REDUCTION: This exam was performed according to the departmental dose-optimization program which includes automated exposure control, adjustment of the mA and/or kV according to patient size and/or use of iterative reconstruction technique. CONTRAST:  75mL ISOVUE-370 IOPAMIDOL (ISOVUE-370) INJECTION 76% COMPARISON:  None Available. FINDINGS: Lower chest:  No contributory findings. Hepatobiliary: No focal  liver abnormality.No evidence of biliary obstruction or stone. Pancreas: Unremarkable. Spleen: Unremarkable. Adrenals/Urinary Tract: Negative adrenals. No hydronephrosis or stone. Unremarkable bladder. Stomach/Bowel: Pronounced inflammation around the thickened appendix which measures up to 13 mm in thickness. Appendicolith is present at the appendiceal base. The appendix is partially retrocecal. Submucosal low-density thickening at the base of cecum and secondarily involving the terminal ileum. Expected enlargement of regional lymph nodes. No perforation or abscess. No ileus or bowel obstruction. Vascular/Lymphatic: No acute vascular abnormality. Scattered atheromatous calcification of the aorta and iliacs, notable for age. No mass or adenopathy. Reproductive:No pathologic findings. Other: No ascites or pneumoperitoneum. Musculoskeletal: No acute abnormalities. IMPRESSION: Acute suppurative appendicitis with appendicolith.  No abscess. Electronically Signed   By: Tiburcio Pea M.D.   On: 06/05/2023 06:23    Anti-infectives: Anti-infectives (From admission, onward)    Start     Dose/Rate Route Frequency Ordered Stop   06/05/23 1900  ceFEPIme (MAXIPIME) 2 g in sodium chloride 0.9 % 100 mL IVPB       Placed in "And" Linked Group   2 g 200 mL/hr over 30 Minutes Intravenous Every 8 hours 06/05/23 1810 06/12/23 2159   06/05/23 1810  metroNIDAZOLE (FLAGYL) IVPB 500 mg       Placed in "And" Linked Group   500 mg 100 mL/hr over 60 Minutes Intravenous Every 12 hours 06/05/23 1810 06/12/23 1859   06/05/23 0630  cefTRIAXone (ROCEPHIN) 2 g in sodium chloride 0.9 % 100 mL IVPB       Placed in "And" Linked Group   2 g 200 mL/hr over 30 Minutes Intravenous  Once 06/05/23 0626 06/05/23 0731   06/05/23  0630  metroNIDAZOLE (FLAGYL) IVPB 500 mg       Placed in "And" Linked Group   500 mg 100 mL/hr over 60 Minutes Intravenous  Once 06/05/23 0626 06/05/23 1102       Assessment/Plan: s/p  Procedure(s): APPENDECTOMY LAPAROSCOPIC (N/A) .  Low-grade fever noted  Continue IV antibiotics today  Patient desires to stay on a clear liquid diet for now  Encourage ambulation    LOS: 0 days    Dortha Schwalbe MD 06/06/2023

## 2023-06-07 LAB — CBC
HCT: 38.6 % — ABNORMAL LOW (ref 39.0–52.0)
Hemoglobin: 13 g/dL (ref 13.0–17.0)
MCH: 32 pg (ref 26.0–34.0)
MCHC: 33.7 g/dL (ref 30.0–36.0)
MCV: 95.1 fL (ref 80.0–100.0)
Platelets: 218 10*3/uL (ref 150–400)
RBC: 4.06 MIL/uL — ABNORMAL LOW (ref 4.22–5.81)
RDW: 12.9 % (ref 11.5–15.5)
WBC: 15.1 10*3/uL — ABNORMAL HIGH (ref 4.0–10.5)
nRBC: 0 % (ref 0.0–0.2)

## 2023-06-07 LAB — SURGICAL PATHOLOGY

## 2023-06-07 MED ORDER — BOOST / RESOURCE BREEZE PO LIQD CUSTOM
1.0000 | Freq: Three times a day (TID) | ORAL | Status: DC
  Administered 2023-06-07 (×2): 1 via ORAL
  Administered 2023-06-08: 237 mL via ORAL

## 2023-06-07 MED ORDER — SIMETHICONE 80 MG PO CHEW: 80.0000 mg | CHEWABLE_TABLET | Freq: Four times a day (QID) | ORAL | Status: DC | PRN

## 2023-06-07 NOTE — Progress Notes (Signed)
Patient ID: Walter Barajas, male   DOB: 1975-12-02, 47 y.o.   MRN: [id number] [facility] Surgery Progress Note  2 Days Post-Op  Subjective: CC-  Abdominal pain slightly better. Still feels bloating and has no appetite. On soft diet but mostly just drinking liquids. Passing flatus, no BM. He did get OOB to ambulate yesterday.  Objective: Vital signs in last 24 hours: Temp:  [97.9 F (36.6 C)-99.7 F (37.6 C)] 97.9 F (36.6 C) (11/15 0802) Pulse Rate:  [86-98] 87 (11/15 0802) Resp:  [16-20] 17 (11/15 0802) BP: (126-134)/(86-95) 134/93 (11/15 0802) SpO2:  [87 %-95 %] 95 % (11/15 0802) Last BM Date :  (PTA)  Intake/Output from previous day: 11/14 0701 - 11/15 0700 In: 300 [P.O.:300] Out: 40 [Drains:40] Intake/Output this shift: No intake/output data recorded.  PE: Gen:  Alert, NAD Pulm: rate and effort normal Abd: Soft, distended, bowel sounds present, mostly tender across lower abdomen, incisions cdi, JP serosanguinous  Lab Results:  Recent Labs    06/06/23 0835 06/07/23 0706  WBC 14.7* 15.1*  HGB 13.6 13.0  HCT 40.5 38.6*  PLT 260 218   BMET Recent Labs    06/05/23 0402 06/06/23 0835  NA 133* 134*  K 3.8 3.5  CL 101 103  CO2 23 23  GLUCOSE 117* 131*  BUN 19 13  CREATININE 0.91 0.90  CALCIUM 10.0 8.5*   PT/INR No results for input(s): "LABPROT", "INR" in the last 72 hours. CMP     Component Value Date/Time   NA 134 (L) 06/06/2023 0835   K 3.5 06/06/2023 0835   CL 103 06/06/2023 0835   CO2 23 06/06/2023 0835   GLUCOSE 131 (H) 06/06/2023 0835   BUN 13 06/06/2023 0835   CREATININE 0.90 06/06/2023 0835   CALCIUM 8.5 (L) 06/06/2023 0835   PROT 7.3 06/05/2023 0402   ALBUMIN 4.1 06/05/2023 0402   AST 45 (H) 06/05/2023 0402   ALT 68 (H) 06/05/2023 0402   ALKPHOS 127 (H) 06/05/2023 0402   BILITOT 0.7 06/05/2023 0402   GFRNONAA >60 06/06/2023 0835   Lipase     Component Value Date/Time   LIPASE 30 06/05/2023 0402        Studies/Results: No results found.  Anti-infectives: Anti-infectives (From admission, onward)    Start     Dose/Rate Route Frequency Ordered Stop   06/05/23 1900  ceFEPIme (MAXIPIME) 2 g in sodium chloride 0.9 % 100 mL IVPB       Placed in "And" Linked Group   2 g 200 mL/hr over 30 Minutes Intravenous Every 8 hours 06/05/23 1810 06/12/23 2159   06/05/23 1810  metroNIDAZOLE (FLAGYL) IVPB 500 mg       Placed in "And" Linked Group   500 mg 100 mL/hr over 60 Minutes Intravenous Every 12 hours 06/05/23 1810 06/12/23 1859   06/05/23 0630  cefTRIAXone (ROCEPHIN) 2 g in sodium chloride 0.9 % 100 mL IVPB       Placed in "And" Linked Group   2 g 200 mL/hr over 30 Minutes Intravenous  Once 06/05/23 0626 06/05/23 0731   06/05/23 0630  metroNIDAZOLE (FLAGYL) IVPB 500 mg       Placed in "And" Linked Group   500 mg 100 mL/hr over 60 Minutes Intravenous  Once 06/05/23 0626 06/05/23 1102        Assessment/Plan Perforated appendicitis with periappendiceal abscess  POD#2 s/p Laparoscopic appendectomy 11/13 Dr. Luisa Hart - ileus, but he is passing some flatus and no n/v. On soft diet,  encouraged him to take this slow - mobilize - WBC slightly up 15.1, afebrile, monitor. Continue IV antibiotics - continue JP, currently serosanguinous - labs in AM  ID - maxipime/flagyl FEN - soft diet VTE - SCDs, lovenox Foley - none    LOS: 1 day    Franne Forts, [facility] 06/07/2023, 10:15 AM Please see Amion for pager number during day hours 7:00am-4:30pm

## 2023-06-07 NOTE — Plan of Care (Signed)

## 2023-06-08 LAB — BASIC METABOLIC PANEL
Anion gap: 9 (ref 5–15)
BUN: 13 mg/dL (ref 6–20)
CO2: 26 mmol/L (ref 22–32)
Calcium: 9.1 mg/dL (ref 8.9–10.3)
Chloride: 100 mmol/L (ref 98–111)
Creatinine, Ser: 0.91 mg/dL (ref 0.61–1.24)
GFR, Estimated: >60 mL/min (ref >60)
Glucose, Bld: 103 mg/dL — ABNORMAL HIGH (ref 70–99)
Potassium: 4 mmol/L (ref 3.5–5.1)
Sodium: 135 mmol/L (ref 135–145)

## 2023-06-08 LAB — CBC
HCT: 40.7 % (ref 39.0–52.0)
Hemoglobin: 13.7 g/dL (ref 13.0–17.0)
MCH: 31.7 pg (ref 26.0–34.0)
MCHC: 33.7 g/dL (ref 30.0–36.0)
MCV: 94.2 fL (ref 80.0–100.0)
Platelets: 339 10*3/uL (ref 150–400)
RBC: 4.32 MIL/uL (ref 4.22–5.81)
RDW: 12.8 % (ref 11.5–15.5)
WBC: 14 10*3/uL — ABNORMAL HIGH (ref 4.0–10.5)
nRBC: 0 % (ref 0.0–0.2)

## 2023-06-08 MED ORDER — POLYETHYLENE GLYCOL 3350 17 G PO PACK
17.0000 g | PACK | Freq: Every day | ORAL | Status: DC | PRN
  Administered 2023-06-08 (×2): 17 g via ORAL
  Filled 2023-06-08 (×2): qty 1

## 2023-06-08 NOTE — Plan of Care (Signed)
  Problem: Education: Goal: Knowledge of General Education information will improve Description: Including pain rating scale, medication(s)/side effects and non-pharmacologic comfort measures Outcome: Progressing   Problem: Health Behavior/Discharge Planning: Goal: Ability to manage health-related needs will improve Outcome: Progressing   Problem: Clinical Measurements: Goal: Ability to maintain clinical measurements within normal limits will improve Outcome: Progressing Goal: Will remain free from infection Outcome: Progressing Goal: Diagnostic test results will improve Outcome: Progressing Goal: Respiratory complications will improve Outcome: Progressing Goal: Cardiovascular complication will be avoided Outcome: Progressing   Problem: Activity: Goal: Risk for activity intolerance will decrease Outcome: Progressing   Problem: Nutrition: Goal: Adequate nutrition will be maintained Outcome: Progressing   Problem: Coping: Goal: Level of anxiety will decrease Outcome: Progressing   Problem: Elimination: Goal: Will not experience complications related to urinary retention Outcome: Progressing   Problem: Pain Management: Goal: General experience of comfort will improve Outcome: Progressing   Problem: Safety: Goal: Ability to remain free from injury will improve Outcome: Progressing   Problem: Skin Integrity: Goal: Risk for impaired skin integrity will decrease Outcome: Progressing

## 2023-06-08 NOTE — Progress Notes (Signed)
3 Days Post-Op   Subjective/Chief Complaint: PT with flatus No BMs Tol PO   Objective: Vital signs in last 24 hours: Temp:  [98.4 F (36.9 C)-98.7 F (37.1 C)] 98.4 F (36.9 C) (11/16 0724) Pulse Rate:  [83-94] 87 (11/16 0724) Resp:  [16-18] 16 (11/16 0724) BP: (121-134)/(83-90) 121/83 (11/16 0724) SpO2:  [94 %-97 %] 97 % (11/16 0724) Last BM Date :  (PTA)  Intake/Output from previous day: 11/15 0701 - 11/16 0700 In: 285.4 [IV Piggyback:285.4] Out: 20 [Drains:20] Intake/Output this shift: No intake/output data recorded.  General appearance: alert and cooperative GI: soft, non-tender; bowel sounds normal; no masses,  no organomegaly and dr Lindley Magnus  Lab Results:  Recent Labs    06/07/23 0706 06/08/23 0437  WBC 15.1* 14.0*  HGB 13.0 13.7  HCT 38.6* 40.7  PLT 218 339   BMET Recent Labs    06/06/23 0835 06/08/23 0437  NA 134* 135  K 3.5 4.0  CL 103 100  CO2 23 26  GLUCOSE 131* 103*  BUN 13 13  CREATININE 0.90 0.91  CALCIUM 8.5* 9.1   PT/INR No results for input(s): "LABPROT", "INR" in the last 72 hours. ABG No results for input(s): "PHART", "HCO3" in the last 72 hours.  Invalid input(s): "PCO2", "PO2"  Studies/Results: No results found.  Anti-infectives: Anti-infectives (From admission, onward)    Start     Dose/Rate Route Frequency Ordered Stop   06/05/23 1900  ceFEPIme (MAXIPIME) 2 g in sodium chloride 0.9 % 100 mL IVPB       Placed in "And" Linked Group   2 g 200 mL/hr over 30 Minutes Intravenous Every 8 hours 06/05/23 1810 06/12/23 2159   06/05/23 1810  metroNIDAZOLE (FLAGYL) IVPB 500 mg       Placed in "And" Linked Group   500 mg 100 mL/hr over 60 Minutes Intravenous Every 12 hours 06/05/23 1810 06/12/23 1859   06/05/23 0630  cefTRIAXone (ROCEPHIN) 2 g in sodium chloride 0.9 % 100 mL IVPB       Placed in "And" Linked Group   2 g 200 mL/hr over 30 Minutes Intravenous  Once 06/05/23 0626 06/05/23 0731   06/05/23 0630  metroNIDAZOLE (FLAGYL)  IVPB 500 mg       Placed in "And" Linked Group   500 mg 100 mL/hr over 60 Minutes Intravenous  Once 06/05/23 0626 06/05/23 1102       Assessment/Plan: Perforated appendicitis with periappendiceal abscess  POD#3 s/p Laparoscopic appendectomy 11/13 Dr. Luisa Hart - ileus resolving, but he is passing some flatus and no n/v. On soft diet, encouraged him to take this slow - mobilize - WBC trending down, afebrile, monitor. Continue IV antibiotics - continue JP, currently serosanguinous - labs in AM   ID - maxipime/flagyl FEN - soft diet VTE - SCDs, lovenox Foley - none  Dispo: maybe home Sunday if con't to do well   LOS: 2 days    Axel Filler 06/08/2023

## 2023-06-08 NOTE — Plan of Care (Signed)
  Problem: Education: Goal: Knowledge of General Education information will improve Description: Including pain rating scale, medication(s)/side effects and non-pharmacologic comfort measures Outcome: Progressing   Problem: Activity: Goal: Risk for activity intolerance will decrease Outcome: Progressing   Problem: Coping: Goal: Level of anxiety will decrease Outcome: Progressing   Problem: Elimination: Goal: Will not experience complications related to bowel motility Outcome: Progressing   Problem: Pain Management: Goal: General experience of comfort will improve Outcome: Progressing   Problem: Safety: Goal: Ability to remain free from injury will improve Outcome: Progressing

## 2023-06-08 NOTE — Plan of Care (Deleted)
  Problem: Education: Goal: Knowledge of General Education information will improve Description: Including pain rating scale, medication(s)/side effects and non-pharmacologic comfort measures 06/08/2023 1901 by Loetta Rough, LPN Outcome: Progressing 06/08/2023 1900 by Loetta Rough, LPN Outcome: Progressing   Problem: Health Behavior/Discharge Planning: Goal: Ability to manage health-related needs will improve 06/08/2023 1901 by Loetta Rough, LPN Outcome: Progressing 06/08/2023 1900 by Loetta Rough, LPN Outcome: Progressing   Problem: Clinical Measurements: Goal: Ability to maintain clinical measurements within normal limits will improve 06/08/2023 1901 by Loetta Rough, LPN Outcome: Progressing 06/08/2023 1900 by Loetta Rough, LPN Outcome: Progressing Goal: Will remain free from infection 06/08/2023 1901 by Loetta Rough, LPN Outcome: Progressing 06/08/2023 1900 by Loetta Rough, LPN Outcome: Progressing Goal: Diagnostic test results will improve 06/08/2023 1901 by Loetta Rough, LPN Outcome: Progressing 06/08/2023 1900 by Loetta Rough, LPN Outcome: Progressing Goal: Respiratory complications will improve 06/08/2023 1901 by Loetta Rough, LPN Outcome: Progressing 06/08/2023 1900 by Loetta Rough, LPN Outcome: Progressing Goal: Cardiovascular complication will be avoided 06/08/2023 1901 by Loetta Rough, LPN Outcome: Progressing 06/08/2023 1900 by Loetta Rough, LPN Outcome: Progressing   Problem: Activity: Goal: Risk for activity intolerance will decrease 06/08/2023 1901 by Loetta Rough, LPN Outcome: Progressing 06/08/2023 1900 by Loetta Rough, LPN Outcome: Progressing   Problem: Nutrition: Goal: Adequate nutrition will be maintained 06/08/2023 1901 by Loetta Rough, LPN Outcome: Progressing 06/08/2023 1900 by Loetta Rough, LPN Outcome: Progressing   Problem: Coping: Goal: Level of anxiety will  decrease 06/08/2023 1901 by Loetta Rough, LPN Outcome: Progressing 06/08/2023 1900 by Loetta Rough, LPN Outcome: Progressing   Problem: Elimination: Goal: Will not experience complications related to urinary retention 06/08/2023 1901 by Loetta Rough, LPN Outcome: Progressing 06/08/2023 1900 by Loetta Rough, LPN Outcome: Progressing   Problem: Pain Management: Goal: General experience of comfort will improve 06/08/2023 1901 by Loetta Rough, LPN Outcome: Progressing 06/08/2023 1900 by Loetta Rough, LPN Outcome: Progressing   Problem: Safety: Goal: Ability to remain free from injury will improve 06/08/2023 1901 by Loetta Rough, LPN Outcome: Progressing 06/08/2023 1900 by Loetta Rough, LPN Outcome: Progressing   Problem: Skin Integrity: Goal: Risk for impaired skin integrity will decrease 06/08/2023 1901 by Loetta Rough, LPN Outcome: Progressing 06/08/2023 1900 by Loetta Rough, LPN Outcome: Progressing

## 2023-06-08 NOTE — Progress Notes (Signed)
Pt requested enema. Notified Dr. Derrell Lolling. He advised pt can have another dose of Miralax but no enema at this time.

## 2023-06-09 MED ORDER — CIPROFLOXACIN HCL 500 MG PO TABS
500.0000 mg | ORAL_TABLET | Freq: Two times a day (BID) | ORAL | 0 refills | Status: AC
Start: 2023-06-09 — End: 2023-06-19

## 2023-06-09 MED ORDER — METRONIDAZOLE 500 MG PO TABS: 500.0000 mg | ORAL_TABLET | Freq: Three times a day (TID) | ORAL | 0 refills | Status: AC

## 2023-06-09 MED ORDER — OXYCODONE-ACETAMINOPHEN 5-325 MG PO TABS: 1.0000 | ORAL_TABLET | ORAL | 0 refills | Status: AC | PRN

## 2023-06-09 NOTE — Plan of Care (Signed)
  Problem: Education: Goal: Knowledge of General Education information will improve Description: Including pain rating scale, medication(s)/side effects and non-pharmacologic comfort measures 06/09/2023 0856 by Loetta Rough, LPN Outcome: Adequate for Discharge 06/08/2023 1901 by Loetta Rough, LPN Outcome: Progressing 06/08/2023 1900 by Loetta Rough, LPN Outcome: Progressing   Problem: Health Behavior/Discharge Planning: Goal: Ability to manage health-related needs will improve 06/09/2023 0856 by Loetta Rough, LPN Outcome: Adequate for Discharge 06/08/2023 1901 by Loetta Rough, LPN Outcome: Progressing 06/08/2023 1900 by Loetta Rough, LPN Outcome: Progressing   Problem: Clinical Measurements: Goal: Ability to maintain clinical measurements within normal limits will improve 06/09/2023 0856 by Loetta Rough, LPN Outcome: Adequate for Discharge 06/08/2023 1901 by Loetta Rough, LPN Outcome: Progressing 06/08/2023 1900 by Loetta Rough, LPN Outcome: Progressing Goal: Will remain free from infection 06/09/2023 0856 by Loetta Rough, LPN Outcome: Adequate for Discharge 06/08/2023 1901 by Loetta Rough, LPN Outcome: Progressing 06/08/2023 1900 by Loetta Rough, LPN Outcome: Progressing Goal: Diagnostic test results will improve 06/09/2023 0856 by Loetta Rough, LPN Outcome: Adequate for Discharge 06/08/2023 1901 by Loetta Rough, LPN Outcome: Progressing 06/08/2023 1900 by Loetta Rough, LPN Outcome: Progressing Goal: Respiratory complications will improve 06/09/2023 0856 by Loetta Rough, LPN Outcome: Adequate for Discharge 06/08/2023 1901 by Loetta Rough, LPN Outcome: Progressing 06/08/2023 1900 by Loetta Rough, LPN Outcome: Progressing Goal: Cardiovascular complication will be avoided 06/09/2023 0856 by Loetta Rough, LPN Outcome: Adequate for Discharge 06/08/2023 1901 by Loetta Rough, LPN Outcome:  Progressing 06/08/2023 1900 by Loetta Rough, LPN Outcome: Progressing   Problem: Activity: Goal: Risk for activity intolerance will decrease 06/09/2023 0856 by Loetta Rough, LPN Outcome: Adequate for Discharge 06/08/2023 1901 by Loetta Rough, LPN Outcome: Progressing 06/08/2023 1900 by Loetta Rough, LPN Outcome: Progressing   Problem: Nutrition: Goal: Adequate nutrition will be maintained 06/09/2023 0856 by Loetta Rough, LPN Outcome: Adequate for Discharge 06/08/2023 1901 by Loetta Rough, LPN Outcome: Progressing 06/08/2023 1900 by Loetta Rough, LPN Outcome: Progressing   Problem: Coping: Goal: Level of anxiety will decrease 06/09/2023 0856 by Loetta Rough, LPN Outcome: Adequate for Discharge 06/08/2023 1901 by Loetta Rough, LPN Outcome: Progressing 06/08/2023 1900 by Loetta Rough, LPN Outcome: Progressing   Problem: Elimination: Goal: Will not experience complications related to bowel motility 06/09/2023 0856 by Loetta Rough, LPN Outcome: Adequate for Discharge 06/08/2023 1901 by Loetta Rough, LPN Outcome: Not Progressing 06/08/2023 1900 by Loetta Rough, LPN Outcome: Not Progressing Goal: Will not experience complications related to urinary retention 06/09/2023 0856 by Loetta Rough, LPN Outcome: Adequate for Discharge 06/08/2023 1901 by Loetta Rough, LPN Outcome: Progressing 06/08/2023 1900 by Loetta Rough, LPN Outcome: Progressing   Problem: Pain Management: Goal: General experience of comfort will improve 06/09/2023 0856 by Loetta Rough, LPN Outcome: Adequate for Discharge 06/08/2023 1901 by Loetta Rough, LPN Outcome: Progressing 06/08/2023 1900 by Loetta Rough, LPN Outcome: Progressing   Problem: Safety: Goal: Ability to remain free from injury will improve 06/09/2023 0856 by Loetta Rough, LPN Outcome: Adequate for Discharge 06/08/2023 1901 by Loetta Rough, LPN Outcome:  Progressing 06/08/2023 1900 by Loetta Rough, LPN Outcome: Progressing   Problem: Skin Integrity: Goal: Risk for impaired skin integrity will decrease 06/09/2023 0856 by Loetta Rough, LPN Outcome: Adequate for Discharge 06/08/2023 1901 by Loetta Rough, LPN Outcome: Progressing 06/08/2023 1900 by Loetta Rough, LPN Outcome: Progressing

## 2023-06-09 NOTE — Discharge Summary (Signed)
Physician Discharge Summary  Patient ID: Walter Barajas MRN: [id number] DOB/AGE: Mar 18, 1976 47 y.o.  Admit date: 06/05/2023 Discharge date: 06/09/2023  Admission Diagnoses: Acute appendicitis  Discharge Diagnoses:  Principal Problem:   Perforated appendicitis Status post appendectomy  Discharged Condition: good  Hospital Course: Patient is a 47 year old male who came in secondary to acute appendicitis.  Patient underwent lap appendectomy.  Patient was found to have perforated appendicitis.  Drain placement.  Patient had antibiotics in the minimal ileus to resolve quickly.  He was tolerating p.o. and having bowel movements.  Patient was otherwise deemed safe for discharge and discharged home.  Consults: None  Significant Diagnostic Studies: CT scan with acute appendicitis  Treatments: surgery: As above  Discharge Exam: Blood pressure (!) 139/104, pulse 100, temperature 98.3 F (36.8 C), temperature source Oral, resp. rate 16, height 5\' 9"  (1.753 m), weight 72.6 kg, SpO2 96%. General appearance: alert and cooperative GI: soft, non-tender; bowel sounds normal; no masses,  no organomegaly and drain is serosanguineous, incisions are clean dry and intact  Disposition: Discharge disposition: 01-Home or Self Care       Discharge Instructions     Diet - low sodium heart healthy   Complete by: As directed    Increase activity slowly   Complete by: As directed       Allergies as of 06/09/2023       Reactions   Ak-mycin [erythromycin]    Nausea and vomiting   Bee Venom Hives   Penicillins         Medication List     TAKE these medications    atorvastatin 20 MG tablet Commonly known as: LIPITOR Take 20 mg by mouth daily.   ciprofloxacin 500 MG tablet Commonly known as: Cipro Take 1 tablet (500 mg total) by mouth 2 (two) times daily for 10 days.   EPINEPHrine 0.3 mg/0.3 mL Soaj injection Commonly known as: EPI-PEN Inject 0.3 mg into the muscle as needed for  anaphylaxis.   esomeprazole 20 MG capsule Commonly known as: NEXIUM Take 20 mg by mouth daily at 12 noon.   fluticasone 50 MCG/ACT nasal spray Commonly known as: FLONASE Place 2 sprays into both nostrils daily.   ketotifen 0.035 % ophthalmic solution Commonly known as: ZADITOR 1 drop as needed (allerfy eyes).   loratadine 10 MG tablet Commonly known as: CLARITIN Take 10 mg by mouth daily.   meloxicam 15 MG tablet Commonly known as: MOBIC Take 15 mg by mouth daily.   metroNIDAZOLE 500 MG tablet Commonly known as: Flagyl Take 1 tablet (500 mg total) by mouth 3 (three) times daily for 10 days.   oxyCODONE-acetaminophen 5-325 MG tablet Commonly known as: Percocet Take 1 tablet by mouth every 4 (four) hours as needed for severe pain (pain score 7-10).   TESTOSTERONE UNDECANOATE PO Take 1 tablet by mouth daily.        Follow-up Information     Scifres, Nicole Cella, [place]. Schedule an appointment as soon as possible for a visit in 1 week(s).   Specialty: Physician Assistant Why: Follow up with your primary care physician to recheck liver function tests (this was elevated in your blood work) Solicitor information: [address] [place] [address] [phone]         Harriette Bouillon, MD. Go on 07/01/2023.   Specialty: General Surgery Why: Your appointment is 12/9 at 2:10pm Arrive 15 minutes early for check in. Contact information: [address] [place] [address] [phone]  [facility], [place]. Go on 06/14/2023.   Specialty: General Surgery Why: Your appointment is 11/22 at 11am with a nurse for drain check/removal Arrive early to check in, fill out paperwork, Bring photo ID and insurance information Contact information: [address] [place] [address] [phone]                Signed: Axel Filler 06/09/2023, 7:53 AM

## 2023-06-09 NOTE — Progress Notes (Signed)
Walter Barajas to be D/C'd  per MD order.  Discussed with the patient and all questions fully answered.  VSS, Skin clean, dry and intact without evidence of skin break down, no evidence of skin tears noted.  IV catheter discontinued intact. Site without signs and symptoms of complications. Dressing and pressure applied.  An After Visit Summary was printed and given to the patient. Patient received prescription.  D/c education completed with patient/family including follow up instructions, medication list, d/c activities limitations if indicated, with other d/c instructions as indicated by MD - patient able to verbalize understanding, all questions fully answered.   Patient instructed to return to ED, call 911, or call MD for any changes in condition.   Patient ambulated independently off the unit to wait for his father to transport him home via private auto.
